# Patient Record
Sex: Female | Born: 1995 | Race: White | Hispanic: No | Smoking: Never smoker
Health system: Southern US, Community
[De-identification: ages and names within clinical notes are randomized; demographics above are authoritative.]

## PROBLEM LIST (undated history)

## (undated) DIAGNOSIS — E669 Obesity, unspecified: Secondary | ICD-10-CM

## (undated) DIAGNOSIS — J309 Allergic rhinitis, unspecified: Secondary | ICD-10-CM

## (undated) HISTORY — PX: WISDOM TOOTH EXTRACTION: SHX21

## (undated) HISTORY — DX: Obesity, unspecified: E66.9

## (undated) HISTORY — PX: NO PAST SURGERIES: SHX2092

## (undated) HISTORY — DX: Allergic rhinitis, unspecified: J30.9

---
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
## 2015-12-04 IMAGING — US US TRANSVAGINAL NON-OB
1 series · 14 of 25 positions shown · non-contrast
Comparison: None

CLINICAL DATA: Pelvic pain, nausea, vomiting, abdominal cramping
with fullness LEFT side of mid abdomen for 1 week

EXAM:
TRANSABDOMINAL AND TRANSVAGINAL ULTRASOUND OF PELVIS
TECHNIQUE: Both transabdominal and transvaginal ultrasound examinations of the
pelvis were performed. Transabdominal technique was performed for
global imaging of the pelvis including uterus, ovaries, adnexal
regions, and pelvic cul-de-sac. It was necessary to proceed with
endovaginal exam following the transabdominal exam to visualize the
endometrium and ovaries.

[Series 1: us transvaginal non-ob · 0.25mm/px · 14 of 67 slices shown]
[im 1/67]
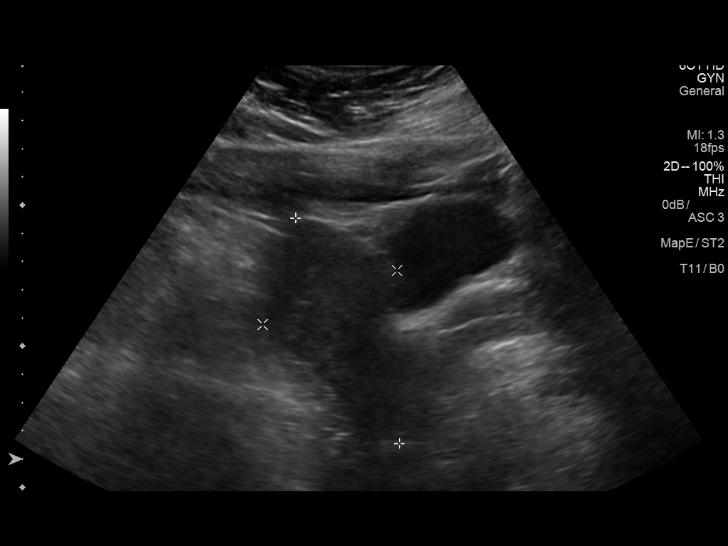
[im 6/67]
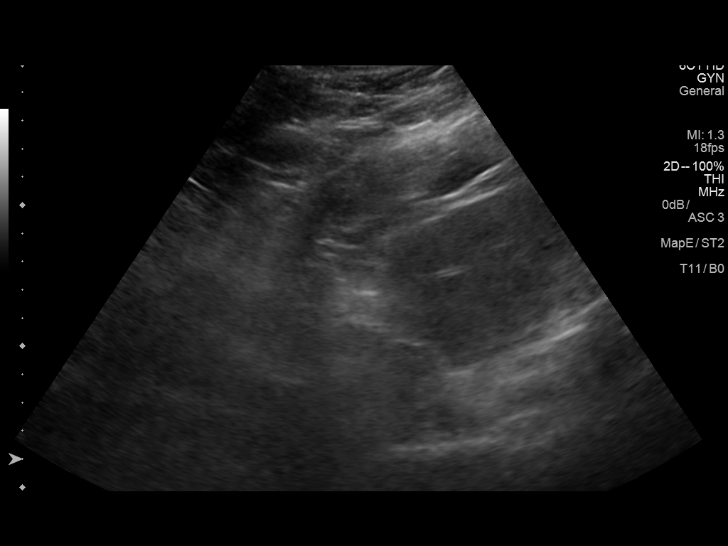
[im 12/67]
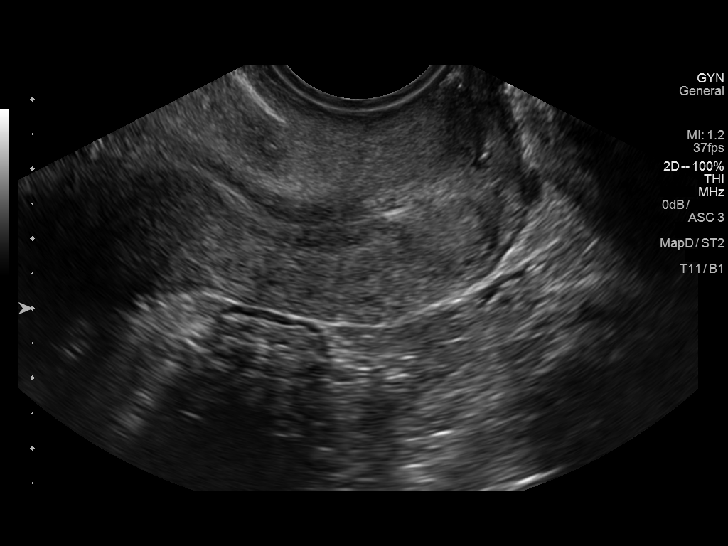
[im 17/67]
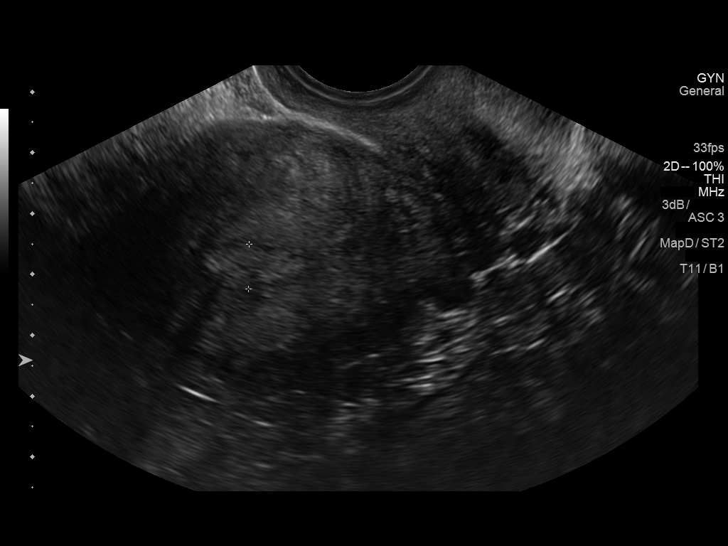
[im 23/67]
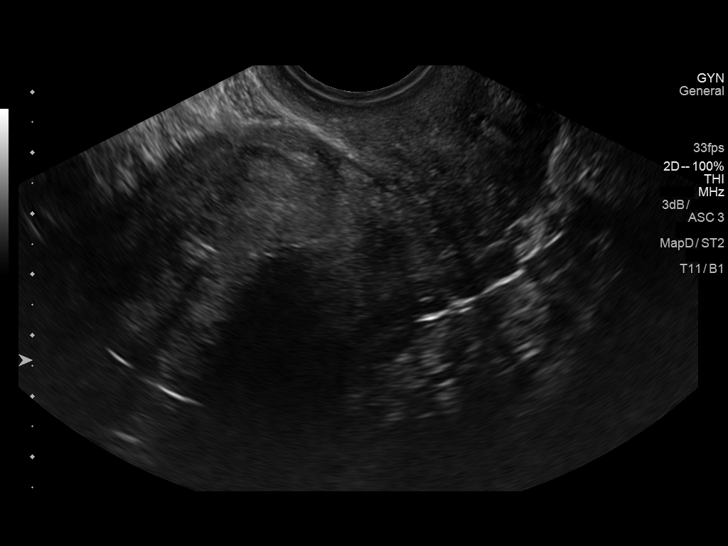
[im 25/67]
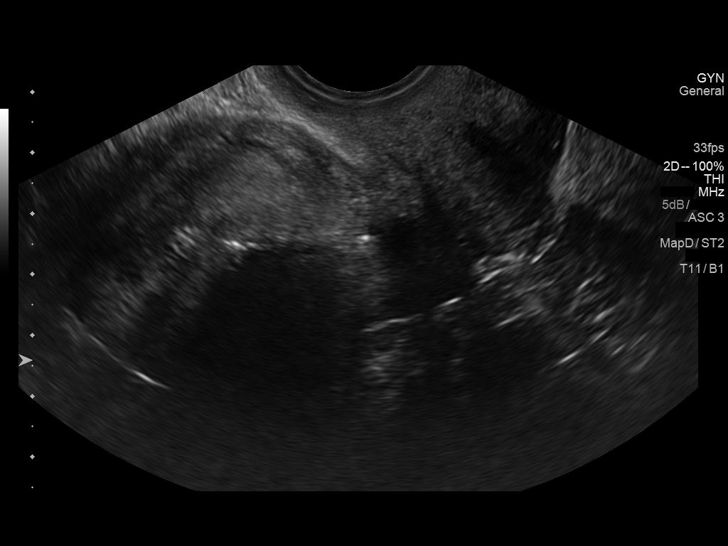
[im 31/67]
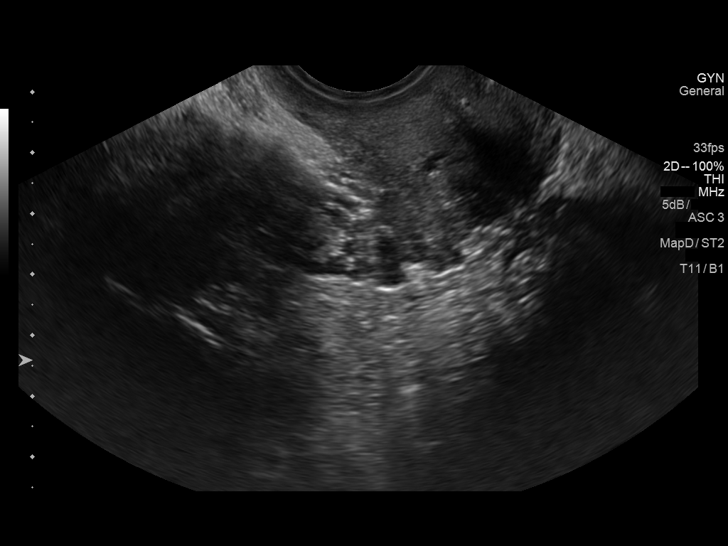
[im 36/67]
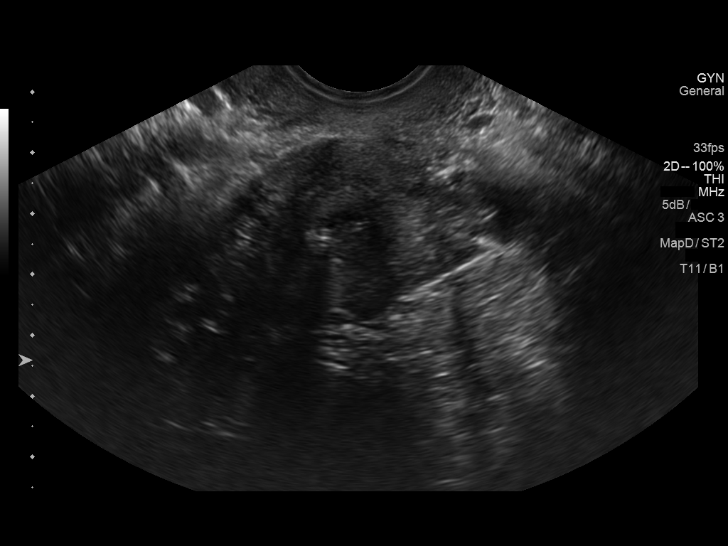
[im 42/67]
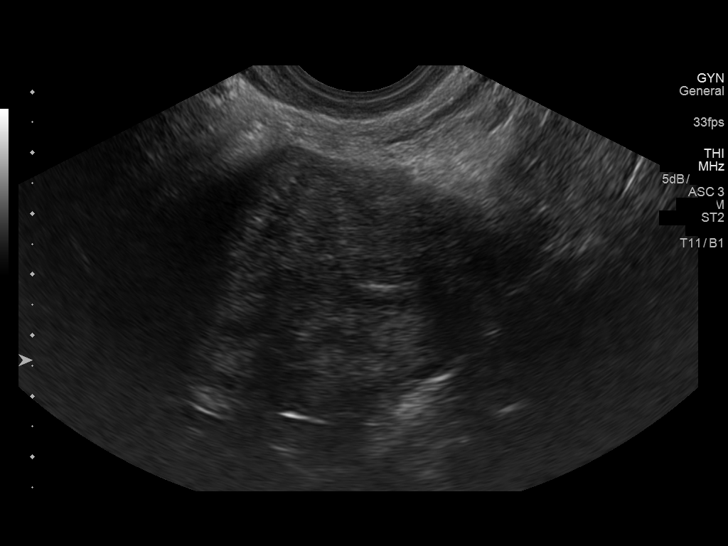
[im 45/67]
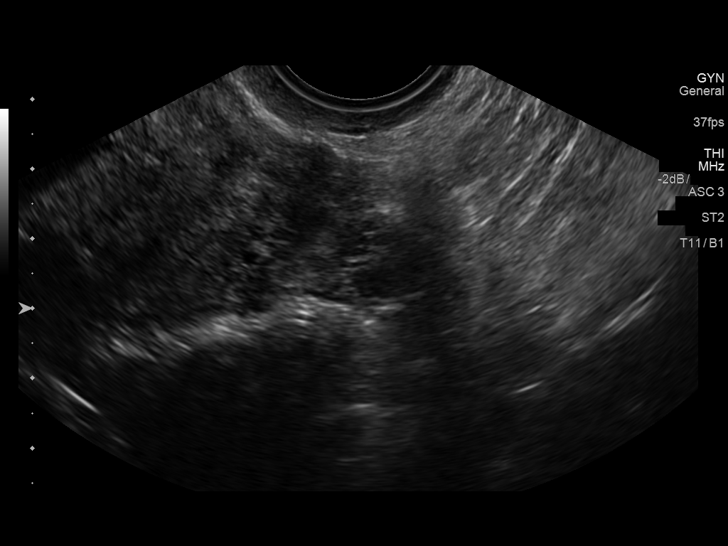
[im 50/67]
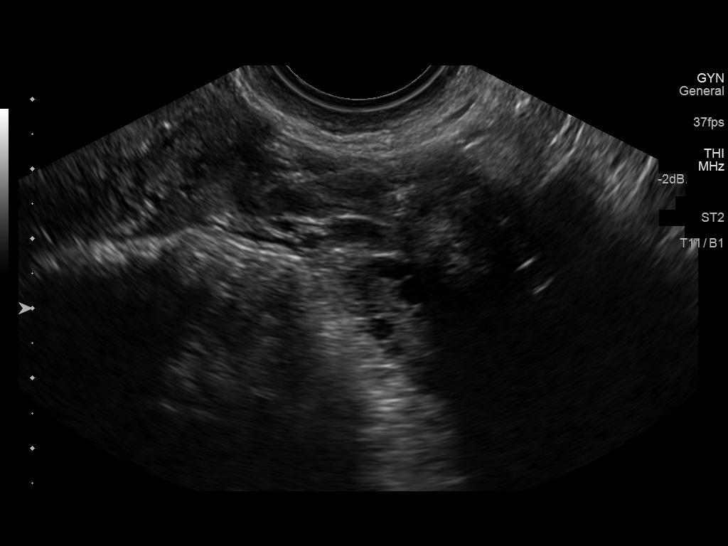
[im 56/67]
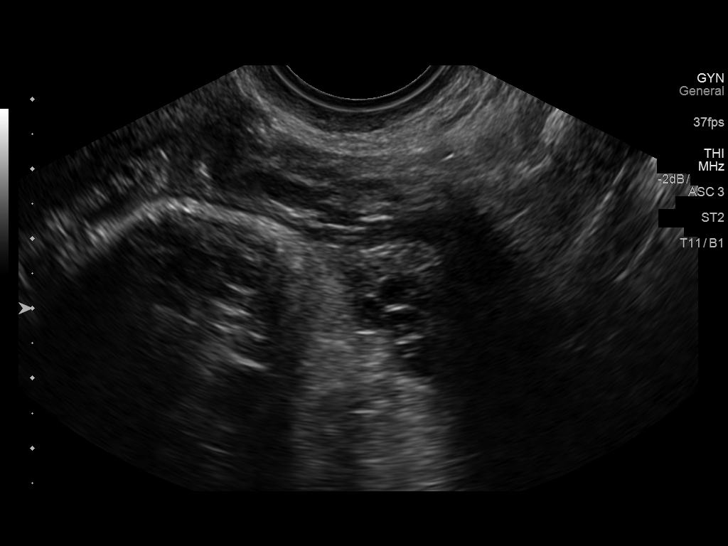
[im 61/67]
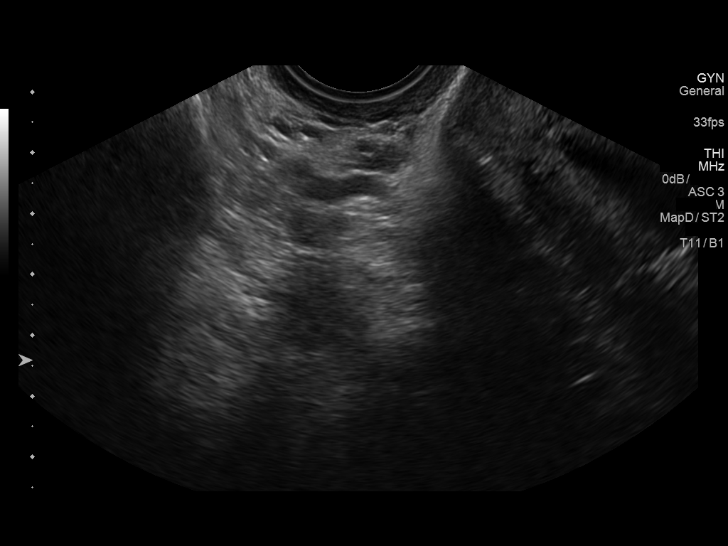
[im 67/67]
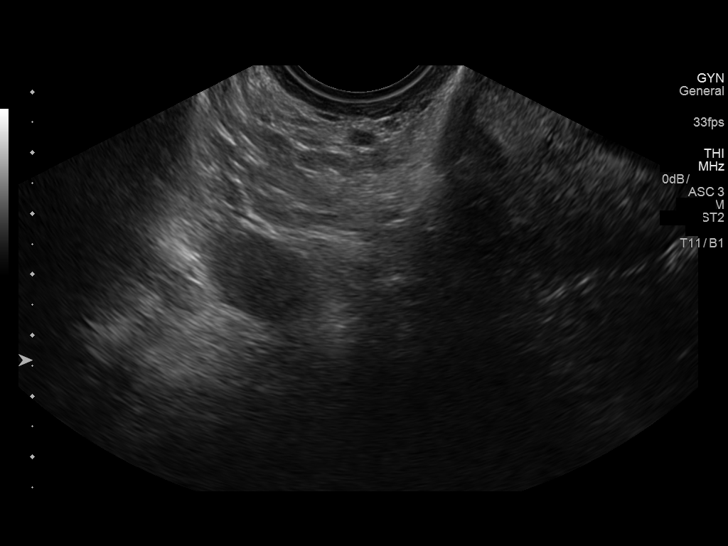

[14 of 25 positions shown; findings below may reference images not displayed]

FINDINGS: Uterus

Measurements: 8.8 x 5.1 x 6.5 cm. Normal morphology without mass

Endometrium

Thickness: 7 mm thick, normal. IUD present within endometrial canal
at mid upper uterine segments with shadowing. No definite
endometrial fluid.

Right ovary

Measurements: 1.9 x 1.6 x 1.5 cm. Suboptimally visualized due to
partial obscuration by bowel. Grossly normal morphology without mass

Left ovary

Measurements: 2.2 x 1.5 x 1.8 cm. Normal morphology without mass.

Other findings

No free pelvic fluid or adnexal masses.
IMPRESSION: IUD within uterus.

Otherwise negative exam.

## 2016-02-21 ENCOUNTER — Ambulatory Visit (INDEPENDENT_AMBULATORY_CARE_PROVIDER_SITE_OTHER): Payer: Medicaid Other | Admitting: Obstetrics and Gynecology

## 2016-02-21 ENCOUNTER — Encounter: Payer: Self-pay | Admitting: Obstetrics and Gynecology

## 2016-02-21 VITALS — BP 140/83 | HR 120 | Ht 69.0 in | Wt 296.9 lb

## 2016-02-21 DIAGNOSIS — Z30432 Encounter for removal of intrauterine contraceptive device: Secondary | ICD-10-CM

## 2016-02-21 DIAGNOSIS — Z3009 Encounter for other general counseling and advice on contraception: Secondary | ICD-10-CM

## 2016-02-21 MED ORDER — MEDROXYPROGESTERONE ACETATE 150 MG/ML IM SUSP: 150.0000 mg | INTRAMUSCULAR | 0 refills | Status: DC

## 2016-02-21 NOTE — Progress Notes (Addendum)
   GYNECOLOGY OFFICE PROCEDURE NOTE  Robin Paganinimber Cameron is a 21 y.o. G2P1001 here for Mirena IUD removal. The patient has had the IUD for almost 2 years.  Notes that she is experiencing worsening pelvic cramping, not relieved by OTC pain meds.  Cramping is intermittent, not related to her cycle.   IUD Removal  Patient identified, informed consent performed, consent signed.  Patient was in the dorsal lithotomy position, normal external genitalia was noted.  A speculum was placed in the patient's vagina, normal discharge was noted, no lesions. The cervix was visualized, no lesions, no abnormal discharge. (The strings of the IUD were not visualized, so Kelly forceps were introduced into the endometrial cavity and the IUD was grasped and removed in its entirety).  Patient tolerated the procedure well.    Patient will use Depo Provera for contraception.  Discussed that due to patient's obesity, the injections may be less effective and may lead to further weight gain. Discussed alternative options. Patient still desires Depo, will use a back up method (condoms)  Routine preventative health maintenance measures emphasized. To return in 2-3 days for Depo injection.    Hildred LaserAnika Amirra Herling, MD [facility]

## 2016-02-22 ENCOUNTER — Telehealth: Payer: Self-pay | Admitting: Obstetrics and Gynecology

## 2016-02-22 DIAGNOSIS — Z30013 Encounter for initial prescription of injectable contraceptive: Secondary | ICD-10-CM

## 2016-02-22 MED ORDER — MEDROXYPROGESTERONE ACETATE 150 MG/ML IM SUSP: 150.0000 mg | INTRAMUSCULAR | 3 refills | Status: DC

## 2016-02-22 NOTE — Telephone Encounter (Signed)
Error corrected. OG

## 2016-02-22 NOTE — Telephone Encounter (Signed)
PT NEEDS HER DEPO SENT TO [facility] INSTEAD OF [facility]

## 2016-02-24 ENCOUNTER — Ambulatory Visit (INDEPENDENT_AMBULATORY_CARE_PROVIDER_SITE_OTHER): Payer: Medicaid Other | Admitting: Obstetrics and Gynecology

## 2016-02-24 VITALS — BP 120/70 | HR 76 | Ht 69.0 in | Wt 296.0 lb

## 2016-02-24 DIAGNOSIS — Z30013 Encounter for initial prescription of injectable contraceptive: Secondary | ICD-10-CM

## 2016-02-24 MED ORDER — MEDROXYPROGESTERONE ACETATE 150 MG/ML IM SUSP
150.0000 mg | Freq: Once | INTRAMUSCULAR | Status: AC
  Administered 2016-02-24: 150 mg via INTRAMUSCULAR

## 2016-02-24 NOTE — Progress Notes (Signed)
Blood pressure 120/70, pulse 76, height 5\' 9"  (1.753 m), weight 296 lb (134.3 kg), last menstrual period 02/07/2016.  Date last pap: N/A Last Depo-Provera: Initiation today Side Effects if any: None Serum HCG indicated? No, neg UPT, 02/21/16 Depo-Provera 150 mg IM given by: Dorita Fray. Robin Cameron, CMA. Pt tolerated well. To call back with any adverse reactions. Next appointment due: April 27 - May 11

## 2016-02-24 NOTE — Patient Instructions (Signed)
Hormonal Contraception Information Introduction Estrogen and progesterone (progestin) are hormones used in many forms of birth control (contraception). These two hormones make up most hormonal contraceptives. Hormonal contraceptives use either:  A combination of estrogen hormone and progesterone hormone in one of these forms:  Pill. Pills come in various combinations of active hormone pills and nonhormonal pills. Different combinations of pills may give you a period once a month, once every 3 months, or no period at all. It is important to take the pills the same time each day.  Patch. The patch is placed on the lower abdomen every week for 3 weeks. On the fourth week, the patch is not placed.  Vaginal ring. The ring is placed in the vagina and left there for 3 weeks. It is then removed for 1 week.  Progesterone alone in one of these forms:  Pill. Hormone pills are taken every day of the cycle.  Intrauterine device (IUD). The IUD is inserted during a menstrual period and removed or replaced every 5 years or sooner.  Implant. Plastic rods are placed under the skin of the upper arm. They are removed or replaced every 3 years or sooner.  Injection. The injection is given once every 90 days. Pregnancy can still occur with any of these hormonal contraceptive methods. If you have any suspicion that you might be pregnant, take a pregnancy test and talk to your health care provider. Estrogen and progesterone contraceptives Estrogen and progesterone contraceptives can prevent pregnancy by:  Stopping the release of an egg (ovulation).  Thickening the mucus of the cervix, making it difficult for sperm to enter the uterus.  Changing the lining of the uterus. This change makes it more difficult for an egg to implant. Progesterone contraceptives Progesterone-only contraceptives can prevent pregnancy by:  Blocking ovulation. This occurs in many women, but some women will continue to  ovulate.  Preventing the entry of sperm into the uterus by keeping the cervical mucus thick and sticky.  Changing the lining of the uterus. This change makes it more difficult for an egg to implant. Side effects Talk to your health care provider about what side effects may affect you. If you develop persistent side effects or if the effects are severe, talk to your health care provider.  Estrogen. Side effects from estrogen occur more often in the first 2-3 months. They include:  Progesterone. Side effects of progesterone can vary. They include: Questions to ask This information is not intended to replace advice given to you by your health care provider. Make sure you discuss any questions you have with your health care provider. Document Released: 01/21/2007 Document Revised: 10/05/2015 Document Reviewed: 06/15/2012  2017 Elsevier  

## 2016-05-11 ENCOUNTER — Ambulatory Visit: Payer: Medicaid Other

## 2016-05-14 ENCOUNTER — Ambulatory Visit (INDEPENDENT_AMBULATORY_CARE_PROVIDER_SITE_OTHER): Payer: Medicaid Other | Admitting: Obstetrics and Gynecology

## 2016-05-14 VITALS — BP 111/84 | HR 69 | Wt 304.2 lb

## 2016-05-14 DIAGNOSIS — Z3042 Encounter for surveillance of injectable contraceptive: Secondary | ICD-10-CM

## 2016-05-14 MED ORDER — MEDROXYPROGESTERONE ACETATE 150 MG/ML IM SUSP
150.0000 mg | Freq: Once | INTRAMUSCULAR | Status: AC
  Administered 2016-05-14: 150 mg via INTRAMUSCULAR

## 2016-05-14 NOTE — Progress Notes (Signed)
Patient ID: Robin Cameron, female   DOB: 06-23-1995, 20 y.o.   MRN: [id number] Pt presents for depo-provera injection without any undesirable side effects.

## 2016-07-31 ENCOUNTER — Ambulatory Visit: Payer: Medicaid Other

## 2016-08-07 ENCOUNTER — Ambulatory Visit (INDEPENDENT_AMBULATORY_CARE_PROVIDER_SITE_OTHER): Payer: Medicaid Other | Admitting: Obstetrics and Gynecology

## 2016-08-07 VITALS — BP 120/84 | HR 62 | Wt 305.7 lb

## 2016-08-07 DIAGNOSIS — Z3042 Encounter for surveillance of injectable contraceptive: Secondary | ICD-10-CM

## 2016-08-07 MED ORDER — MEDROXYPROGESTERONE ACETATE 150 MG/ML IM SUSP
150.0000 mg | Freq: Once | INTRAMUSCULAR | Status: AC
  Administered 2016-08-07: 150 mg via INTRAMUSCULAR

## 2016-08-07 NOTE — Progress Notes (Signed)
Pt is here for depo provera, denies any s/e 

## 2016-08-21 NOTE — Progress Notes (Signed)
I have reviewed the record and concur with patient management and plan.  Hessie Varone, MD [facility]     

## 2016-10-23 ENCOUNTER — Ambulatory Visit (INDEPENDENT_AMBULATORY_CARE_PROVIDER_SITE_OTHER): Payer: Medicaid Other | Admitting: Obstetrics and Gynecology

## 2016-10-23 VITALS — Wt 299.8 lb

## 2016-10-23 DIAGNOSIS — Z3042 Encounter for surveillance of injectable contraceptive: Secondary | ICD-10-CM | POA: Diagnosis not present

## 2016-10-23 MED ORDER — MEDROXYPROGESTERONE ACETATE 150 MG/ML IM SUSP
150.0000 mg | Freq: Once | INTRAMUSCULAR | Status: AC
  Administered 2016-10-23: 150 mg via INTRAMUSCULAR

## 2016-10-23 NOTE — Progress Notes (Signed)
Pt is here for depo provera inj, she is doing well, denies any s/e 

## 2016-10-24 NOTE — Progress Notes (Signed)
I have reviewed the record and concur with patient management. Continue q 3 month Depo Provera injections.

## 2017-01-09 ENCOUNTER — Other Ambulatory Visit: Payer: Self-pay | Admitting: Obstetrics and Gynecology

## 2017-01-09 DIAGNOSIS — Z30013 Encounter for initial prescription of injectable contraceptive: Secondary | ICD-10-CM

## 2017-01-09 NOTE — Telephone Encounter (Signed)
Please inform patient that she is due for ann annual exam.  She can have 1 additional refill until she is seen.

## 2017-01-10 ENCOUNTER — Telehealth: Payer: Self-pay | Admitting: Obstetrics and Gynecology

## 2017-01-10 ENCOUNTER — Other Ambulatory Visit: Payer: Self-pay

## 2017-01-10 DIAGNOSIS — Z30013 Encounter for initial prescription of injectable contraceptive: Secondary | ICD-10-CM

## 2017-01-10 MED ORDER — MEDROXYPROGESTERONE ACETATE 150 MG/ML IM SUSP: 150.0000 mg | INTRAMUSCULAR | 0 refills | Status: DC

## 2017-01-10 NOTE — Telephone Encounter (Signed)
Pt  Aware depo erx. Pt needs AE for further refills. Pt to make appt tomorrow after inj.

## 2017-01-10 NOTE — Telephone Encounter (Signed)
The patient called and stated that she needs her Depo injection refilled before tomorrow , the day of her appointment, The patient would also like a call from a nurse. Please advise.

## 2017-01-11 ENCOUNTER — Ambulatory Visit: Payer: Medicaid Other

## 2017-02-04 ENCOUNTER — Encounter: Payer: Medicaid Other | Admitting: Obstetrics and Gynecology

## 2017-02-19 ENCOUNTER — Ambulatory Visit (INDEPENDENT_AMBULATORY_CARE_PROVIDER_SITE_OTHER): Payer: Medicaid Other | Admitting: Obstetrics and Gynecology

## 2017-02-19 ENCOUNTER — Encounter: Payer: Self-pay | Admitting: Obstetrics and Gynecology

## 2017-02-19 VITALS — BP 136/72 | HR 102 | Wt 312.8 lb

## 2017-02-19 DIAGNOSIS — Z13220 Encounter for screening for lipoid disorders: Secondary | ICD-10-CM | POA: Diagnosis not present

## 2017-02-19 DIAGNOSIS — Z113 Encounter for screening for infections with a predominantly sexual mode of transmission: Secondary | ICD-10-CM

## 2017-02-19 DIAGNOSIS — Z3009 Encounter for other general counseling and advice on contraception: Secondary | ICD-10-CM | POA: Diagnosis not present

## 2017-02-19 DIAGNOSIS — Z01419 Encounter for gynecological examination (general) (routine) without abnormal findings: Secondary | ICD-10-CM

## 2017-02-19 DIAGNOSIS — F39 Unspecified mood [affective] disorder: Secondary | ICD-10-CM

## 2017-02-19 DIAGNOSIS — Z23 Encounter for immunization: Secondary | ICD-10-CM

## 2017-02-19 DIAGNOSIS — Z124 Encounter for screening for malignant neoplasm of cervix: Secondary | ICD-10-CM

## 2017-02-19 NOTE — Progress Notes (Signed)
GYNECOLOGY ANNUAL PHYSICAL EXAM PROGRESS NOTE  Subjective:    Robin Cameron is a 22 y.o. G85P1001 female who presents for an annual exam.  The patient is sexually active.The patient wears seatbelts: yes. The patient participates in regular exercise: no. Has the patient ever been transfused or tattooed?: no. The patient reports that there is not domestic violence in her life.    The patient has the following complaints today:  1. Desires to discuss contraception. Has been off Depo Provera x 2 months. Notes improvement in mood since discontinuing as she was having labile moods and mood swings.  Is still taking her Wellbutrin as prescribed.    Gynecologic History Menarche age: 75 No LMP recorded. Patient has had an injection. Contraception: Depo-Provera injections History of STI's: Denies Last Pap: no prior pap history.     Obstetric History   G2   P1   T1   P0   A0   L1    SAB0   TAB0   Ectopic0   Multiple0   Live Births1     # Outcome Date GA Lbr Len/2nd Weight Sex Delivery Anes PTL Lv  2 Term 08/09/12 [redacted]w[redacted]d 00:10 9 lb (4.082 kg) F Vag-Spont Local  LIV  1 Gravida               Past Medical History:  Diagnosis Date  . Allergic rhinitis   . Obesity     Past Surgical History:  Procedure Laterality Date  . NO PAST SURGERIES    . WISDOM TOOTH EXTRACTION      Family History  Problem Relation Age of Onset  . COPD Maternal Grandmother   . Hypertension Maternal Grandmother   . Hyperlipidemia Maternal Grandfather   . Hypertension Maternal Grandfather   . COPD Maternal Grandfather     Social History   Socioeconomic History  . Marital status: Single    Spouse name: Not on file  . Number of children: Not on file  . Years of education: Not on file  . Highest education level: Not on file  Social Needs  . Financial resource strain: Not on file  . Food insecurity - worry: Not on file  . Food insecurity - inability: Not on file  . Transportation needs - medical: Not on  file  . Transportation needs - non-medical: Not on file  Occupational History  . Not on file  Tobacco Use  . Smoking status: Never Smoker  . Smokeless tobacco: Never Used  Substance and Sexual Activity  . Alcohol use: No  . Drug use: No  . Sexual activity: Yes    Birth control/protection: Injection    Comment: only one partner  Other Topics Concern  . Not on file  Social History Narrative  . Not on file    Current Outpatient Medications on File Prior to Visit  Medication Sig Dispense Refill  . BuPROPion HCl (WELLBUTRIN PO) Take by mouth.    . medroxyPROGESTERone (DEPO-PROVERA) 150 MG/ML injection Inject 1 mL (150 mg total) into the muscle every 3 (three) months. 1 mL 0   No current facility-administered medications on file prior to visit.     Allergies  Allergen Reactions  . Shrimp [Shellfish Allergy] Rash  . Mushroom Extract Complex Rash      Review of Systems Constitutional: negative for chills, fatigue, fevers and sweats Eyes: negative for irritation, redness and visual disturbance Ears, nose, mouth, throat, and face: negative for hearing loss, nasal congestion, snoring and tinnitus Respiratory: negative  for asthma, cough, sputum Cardiovascular: negative for chest pain, dyspnea, exertional chest pressure/discomfort, irregular heart beat, palpitations and syncope Gastrointestinal: negative for abdominal pain, change in bowel habits, nausea and vomiting Genitourinary: negative for abnormal menstrual periods, genital lesions, sexual problems and vaginal discharge, dysuria and urinary incontinence Integument/breast: negative for breast lump, breast tenderness and nipple discharge Hematologic/lymphatic: negative for bleeding and easy bruising Musculoskeletal:negative for back pain and muscle weakness Neurological: negative for dizziness, headaches, vertigo and weakness Endocrine: negative for diabetic symptoms including polydipsia, polyuria and skin  dryness Allergic/Immunologic: negative for hay fever and urticaria    Psychological: positive for - irritability and mood swings.  Negative for - depression, obsessive thoughts, sleep disturbances or suicidal ideation    Objective:  Blood pressure 136/72, pulse (!) 102, weight (!) 312 lb 12.8 oz (141.9 kg). Body mass index is 46.19 kg/m.  General Appearance:    Alert, cooperative, no distress, appears stated age, morbid obesity  Head:    Normocephalic, without obvious abnormality, atraumatic  Eyes:    PERRL, conjunctiva/corneas clear, EOM's intact, both eyes  Ears:    Normal external ear canals, both ears  Nose:   Nares normal, septum midline, mucosa normal, no drainage or sinus tenderness  Throat:   Lips, mucosa, and tongue normal; teeth and gums normal  Neck:   Supple, symmetrical, trachea midline, no adenopathy; thyroid: no enlargement/tenderness/nodules; no carotid bruit or JVD  Back:     Symmetric, no curvature, ROM normal, no CVA tenderness  Lungs:     Clear to auscultation bilaterally, respirations unlabored  Chest Wall:    No tenderness or deformity   Heart:    Regular rate and rhythm, S1 and S2 normal, no murmur, rub or gallop  Breast Exam:    No tenderness, masses, or nipple abnormality  Abdomen:     Soft, non-tender, bowel sounds active all four quadrants, no masses, no organomegaly.    Genitalia:    Pelvic:external genitalia normal, vagina without lesions, discharge, or tenderness, rectovaginal septum  normal. Cervix normal in appearance, no cervical motion tenderness, no adnexal masses or tenderness.  Uterus normal size, shape, mobile, regular contours, nontender.  Rectal:    Normal external sphincter.  No hemorrhoids appreciated. Internal exam not done.   Extremities:   Extremities normal, atraumatic, no cyanosis or edema  Pulses:   2+ and symmetric all extremities  Skin:   Skin color, texture, turgor normal, no rashes or lesions  Lymph nodes:   Cervical, supraclavicular,  and axillary nodes normal  Neurologic:   CNII-XII intact, normal strength, sensation and reflexes throughout     Labs:  Lab Results  Component Value Date   WBC 5.4 11/16/2014   HGB 12.1 11/16/2014   HCT 36.0 11/16/2014   MCV 81.4 11/16/2014   PLT 228 11/16/2014    Lab Results  Component Value Date   CREATININE 0.66 11/16/2014   BUN 14 11/16/2014   NA 140 11/16/2014   K 4.3 11/16/2014   CL 107 11/16/2014   CO2 26 11/16/2014    Lab Results  Component Value Date   ALT 14 11/16/2014   AST 16 11/16/2014   ALKPHOS 71 11/16/2014   BILITOT 0.5 11/16/2014    No results found for: TSH   Assessment:   Healthy female exam.  Cervical cancer screening Morbid obesity Contraception counseling H/o mood disorder NOS STD screening  Plan:     Blood tests: CBC with diff, Comprehensive metabolic panel, Lipoproteins and TSH. Breast self exam technique reviewed and  patient encouraged to perform self-exam monthly. Contraception: none currently.  Reviewed all forms of birth control options available including abstinence; over the counter/barrier methods; hormonal contraceptive medication including pill, patch, ring, injection,contraceptive implant; hormonal and nonhormonal IUDs; permanent sterilization options were not discussed.  modalities. Risks and benefits reviewed.  Questions were answered.  Information was given to patient to review.  Discussed healthy lifestyle modifications. Pap smear performed today.  For flu vaccine today.  Continue Wellbutrin as prescribed.  Notes that discontinuing Depo Provera has also helped her mood. To f/u if symptoms worsen.  Discussed HPV vaccine. Info given.  STD screening ordered at patient's request. Notes she is planning on getting married in October.  Follow up in 1 year, or sooner if needed.     Hildred Laser, MD [facility]

## 2017-02-19 NOTE — Patient Instructions (Signed)

## 2017-02-20 ENCOUNTER — Encounter: Payer: Self-pay | Admitting: Obstetrics and Gynecology

## 2017-02-20 LAB — COMPREHENSIVE METABOLIC PANEL
A/G RATIO: 1.8 (ref 1.2–2.2)
ALT: 26 IU/L (ref 0–32)
AST: 18 IU/L (ref 0–40)
Albumin: 4.5 g/dL (ref 3.5–5.5)
Alkaline Phosphatase: 82 IU/L (ref 39–117)
BUN/Creatinine Ratio: 15 (ref 9–23)
BUN: 11 mg/dL (ref 6–20)
Bilirubin Total: 0.3 mg/dL (ref 0.0–1.2)
CALCIUM: 9.3 mg/dL (ref 8.7–10.2)
CHLORIDE: 107 mmol/L — AB (ref 96–106)
CO2: 22 mmol/L (ref 20–29)
Creatinine, Ser: 0.74 mg/dL (ref 0.57–1.00)
GFR, EST AFRICAN AMERICAN: 134 mL/min/{1.73_m2} (ref >59)
GFR, EST NON AFRICAN AMERICAN: 116 mL/min/{1.73_m2} (ref >59)
GLOBULIN, TOTAL: 2.5 g/dL (ref 1.5–4.5)
Glucose: 90 mg/dL (ref 65–99)
POTASSIUM: 4.2 mmol/L (ref 3.5–5.2)
SODIUM: 143 mmol/L (ref 134–144)
TOTAL PROTEIN: 7 g/dL (ref 6.0–8.5)

## 2017-02-20 LAB — LIPID PANEL
CHOL/HDL RATIO: 3.7 ratio (ref 0.0–4.4)
Cholesterol, Total: 163 mg/dL (ref 100–199)
HDL: 44 mg/dL (ref >39)
LDL Calculated: 100 mg/dL — ABNORMAL HIGH (ref 0–99)
Triglycerides: 95 mg/dL (ref 0–149)
VLDL Cholesterol Cal: 19 mg/dL (ref 5–40)

## 2017-02-20 LAB — CBC
HEMATOCRIT: 40.1 % (ref 34.0–46.6)
Hemoglobin: 13.2 g/dL (ref 11.1–15.9)
MCH: 28.2 pg (ref 26.6–33.0)
MCHC: 32.9 g/dL (ref 31.5–35.7)
MCV: 86 fL (ref 79–97)
PLATELETS: 300 10*3/uL (ref 150–379)
RBC: 4.68 x10E6/uL (ref 3.77–5.28)
RDW: 14.2 % (ref 12.3–15.4)
WBC: 7.7 10*3/uL (ref 3.4–10.8)

## 2017-02-20 LAB — RPR: RPR: NONREACTIVE

## 2017-02-20 LAB — HIV ANTIBODY (ROUTINE TESTING W REFLEX): HIV SCREEN 4TH GENERATION: NONREACTIVE

## 2017-02-20 LAB — TSH: TSH: 1.79 u[IU]/mL (ref 0.450–4.500)

## 2017-02-22 LAB — PAP IG, CT-NG, RFX HPV ASCU
Chlamydia, Nuc. Acid Amp: NEGATIVE
GONOCOCCUS BY NUCLEIC ACID AMP: NEGATIVE
PAP Smear Comment: 0

## 2017-09-23 ENCOUNTER — Encounter: Payer: Self-pay | Admitting: Obstetrics and Gynecology

## 2017-09-23 ENCOUNTER — Ambulatory Visit: Payer: Medicaid Other | Admitting: Obstetrics and Gynecology

## 2017-09-23 VITALS — BP 130/83 | HR 88 | Ht 67.0 in | Wt 301.8 lb

## 2017-09-23 DIAGNOSIS — R102 Pelvic and perineal pain: Secondary | ICD-10-CM

## 2017-09-23 DIAGNOSIS — N926 Irregular menstruation, unspecified: Secondary | ICD-10-CM | POA: Diagnosis not present

## 2017-09-23 DIAGNOSIS — R1011 Right upper quadrant pain: Secondary | ICD-10-CM

## 2017-09-23 DIAGNOSIS — R1114 Bilious vomiting: Secondary | ICD-10-CM

## 2017-09-23 LAB — POCT URINE PREGNANCY: Preg Test, Ur: NEGATIVE

## 2017-09-23 MED ORDER — ONDANSETRON 4 MG PO TBDP: 4.0000 mg | ORAL_TABLET | Freq: Four times a day (QID) | ORAL | 0 refills | Status: DC | PRN

## 2017-09-23 NOTE — Progress Notes (Signed)
    GYNECOLOGY PROGRESS NOTE  Subjective:    Patient ID: Robin Cameron, female    DOB: 11-Jan-1996, 22 y.o.   MRN: [id number]  HPI  Patient is a 22 y.o. G58P1001 female who presents for complaints of vomiting and pelvic pain.  She notes that she has been vomiting for the past 6 days, and has had pelvic pain x 3 weeks.  Patient's last menstrual period was 08/03/2017. Was on Depo Provera for 1 year, but discontinued about 7 months ago. Had last unprotected sex on August 23rd (condom broke). Patient's last menstrual period was 08/03/2017. Reports she took a pregnancy test at home that was negative late last week.    The following portions of the patient's history were reviewed and updated as appropriate: allergies, current medications, past family history, past medical history, past social history, past surgical history and problem list.  Review of Systems Pertinent items noted in HPI and remainder of comprehensive ROS otherwise negative.   Objective:   Blood pressure 130/83, pulse 88, height 5\' 7"  (1.702 m), weight (!) 301 lb 12.8 oz (136.9 kg), last menstrual period 08/03/2017. General appearance: alert and no distress Abdomen: normal findings: bowel sounds normal, no masses palpable and soft, large pannus and abnormal findings:  mild tenderness in the epigastrium and in the LLQ Pelvic: deferred   Assessment:   Upper abdominal pain RLQ pain Pelvic pain Nausea/vomiting Irregular menses Morbid obesity  Plan:   -Patient with nausea/vomiting and RLQ pain and LLQ pain, irregular menses.  UPT today negative, however performed prior to 4 weeks and missed period with recent episode of unprotected intercourse several weeks ago. Will order BHCG.  - Patient notes that her pain is unrelated to eating, however sometimes can have bilious vomiting. Will check RUQ for pain with ultrasound.  Also CMP ordered with lipase.  - Discussed contraception with patient, currently using condoms (although recent  incident with current method).  Declines use of other agents at this time.   Follow up if symptoms worsen. Will prescribe Zofran for nausea.   Hildred Laser, MD [facility]

## 2017-09-23 NOTE — Patient Instructions (Signed)

## 2017-09-23 NOTE — Progress Notes (Signed)
PT is present today for confirmation of pregnancy. Pt LMP 08/03/17. UPT done today results were negative. Pt stated that she is doing well no complaints.

## 2017-09-24 ENCOUNTER — Ambulatory Visit (INDEPENDENT_AMBULATORY_CARE_PROVIDER_SITE_OTHER): Payer: Medicaid Other

## 2017-09-24 DIAGNOSIS — N926 Irregular menstruation, unspecified: Secondary | ICD-10-CM | POA: Diagnosis not present

## 2017-09-24 DIAGNOSIS — R102 Pelvic and perineal pain: Secondary | ICD-10-CM

## 2017-09-24 LAB — BETA HCG QUANT (REF LAB): hCG Quant: <1 m[IU]/mL

## 2017-09-24 LAB — COMPREHENSIVE METABOLIC PANEL
ALK PHOS: 88 IU/L (ref 39–117)
ALT: 12 IU/L (ref 0–32)
AST: 12 IU/L (ref 0–40)
Albumin/Globulin Ratio: 1.6 (ref 1.2–2.2)
Albumin: 4.2 g/dL (ref 3.5–5.5)
BUN/Creatinine Ratio: 24 — ABNORMAL HIGH (ref 9–23)
BUN: 16 mg/dL (ref 6–20)
Bilirubin Total: <0.2 mg/dL (ref 0.0–1.2)
CO2: 20 mmol/L (ref 20–29)
Calcium: 9.2 mg/dL (ref 8.7–10.2)
Chloride: 106 mmol/L (ref 96–106)
Creatinine, Ser: 0.66 mg/dL (ref 0.57–1.00)
GFR calc Af Amer: 145 mL/min/{1.73_m2} (ref >59)
GFR calc non Af Amer: 126 mL/min/{1.73_m2} (ref >59)
GLOBULIN, TOTAL: 2.7 g/dL (ref 1.5–4.5)
Glucose: 95 mg/dL (ref 65–99)
POTASSIUM: 4.4 mmol/L (ref 3.5–5.2)
SODIUM: 140 mmol/L (ref 134–144)
Total Protein: 6.9 g/dL (ref 6.0–8.5)

## 2017-10-31 ENCOUNTER — Telehealth: Payer: Self-pay | Admitting: Obstetrics and Gynecology

## 2017-10-31 NOTE — Telephone Encounter (Signed)
Pt was called to see if she wanted to come into the office to be seen by DJE. Pt did not answer the phone and was unable to leave a message due to the mailbox being full.

## 2017-10-31 NOTE — Telephone Encounter (Signed)
Patient called stating she could not work today and will not be able to work tomorrow due to heavy menstrual bleeding and cramping. She would like a note for work or be seen for an appointment today. Dr Chalmers Guest schedule is full today so let me know if and when I can get her in.Thanks

## 2017-11-05 NOTE — Telephone Encounter (Signed)
Pt called no answer and was unable to leave a message due to mailbox being full. Was calling to check on pt due to her recent call to the office.

## 2017-11-06 NOTE — Telephone Encounter (Signed)
Pt called no answer and unable to leave voicemail due to voicemail being full. Sent pt a mychart message to see how she was doing.

## 2018-02-26 ENCOUNTER — Encounter: Payer: Medicaid Other | Admitting: Obstetrics and Gynecology

## 2018-03-07 ENCOUNTER — Encounter: Payer: Medicaid Other | Admitting: Obstetrics and Gynecology

## 2018-04-10 ENCOUNTER — Telehealth: Payer: Self-pay

## 2018-04-10 NOTE — Telephone Encounter (Signed)
Pt was called to reschedule her annual exam.

## 2018-04-15 ENCOUNTER — Encounter: Payer: Medicaid Other | Admitting: Obstetrics and Gynecology

## 2018-06-13 ENCOUNTER — Encounter: Payer: Self-pay | Admitting: Obstetrics and Gynecology

## 2018-06-24 ENCOUNTER — Other Ambulatory Visit: Payer: Self-pay

## 2018-06-24 ENCOUNTER — Ambulatory Visit: Payer: Medicaid Other | Admitting: Certified Nurse Midwife

## 2018-06-24 ENCOUNTER — Encounter: Payer: Self-pay | Admitting: Certified Nurse Midwife

## 2018-06-24 VITALS — BP 140/86 | HR 101 | Ht 68.0 in | Wt 323.7 lb

## 2018-06-24 DIAGNOSIS — N926 Irregular menstruation, unspecified: Secondary | ICD-10-CM

## 2018-06-24 LAB — POCT URINE PREGNANCY: Preg Test, Ur: POSITIVE — AB

## 2018-06-24 MED ORDER — DOXYLAMINE-PYRIDOXINE 10-10 MG PO TBEC: 2.0000 | DELAYED_RELEASE_TABLET | Freq: Every day | ORAL | 5 refills | Status: DC

## 2018-06-24 MED ORDER — FOLIC ACID 1 MG PO TABS: 1.0000 mg | ORAL_TABLET | Freq: Every day | ORAL | 10 refills | Status: DC

## 2018-06-24 NOTE — Progress Notes (Signed)
GYN ENCOUNTER NOTE  Subjective:       Robin Cameron is a 23 y.o. [phone] female here for pregnancy confirmation.   Reports 10 positive home pregnancy test. Endorses nausea with occasional vomiting, several food aversions, breast tenderness and intermittent abdominal cramping.   History of irregular menses after depo-provera use.   Denies difficulty breathing or respiratory distress, chest pain, abdominal pain, vaginal bleeding, dysuria, and leg pain or swelling.    Gynecologic History  Patient's last menstrual period was 04/27/2018.   Estimated date of birth: 02/01/2019.   Gestational age: 10 weeks 2 days.   Contraception: none  Last Pap: 02/2017. Results: normal.   Obstetric History  OB History  Gravida Para Term Preterm AB Living  2 2 2     2   SAB TAB Ectopic Multiple Live Births          2    # Outcome Date GA Lbr Len/2nd Weight Sex Delivery Anes PTL Lv  2 Term 2015    F Vag-Spont   LIV  1 Term 08/09/12 [redacted]w[redacted]d 00:10 9 lb (4.082 kg) F Vag-Spont Local  LIV     Birth Comments: didn't know she was pregnant    Past Medical History:  Diagnosis Date  . Allergic rhinitis   . Obesity     Past Surgical History:  Procedure Laterality Date  . WISDOM TOOTH EXTRACTION      Allergies  Allergen Reactions  . Shrimp [Shellfish Allergy] Rash  . Mushroom Extract Complex Rash    Social History   Socioeconomic History  . Marital status: Single    Spouse name: Not on file  . Number of children: Not on file  . Years of education: Not on file  . Highest education level: Not on file  Occupational History  . Not on file  Social Needs  . Financial resource strain: Not on file  . Food insecurity:    Worry: Not on file    Inability: Not on file  . Transportation needs:    Medical: Not on file    Non-medical: Not on file  Tobacco Use  . Smoking status: Never Smoker  . Smokeless tobacco: Never Used  Substance and Sexual Activity  . Alcohol use: Not Currently    Comment:  ocass  . Drug use: No  . Sexual activity: Yes    Comment: only one partner  Lifestyle  . Physical activity:    Days per week: Not on file    Minutes per session: Not on file  . Stress: Not on file  Relationships  . Social connections:    Talks on phone: Not on file    Gets together: Not on file    Attends religious service: Not on file    Active member of club or organization: Not on file    Attends meetings of clubs or organizations: Not on file    Relationship status: Not on file  . Intimate partner violence:    Fear of current or ex partner: Not on file    Emotionally abused: Not on file    Physically abused: Not on file    Forced sexual activity: Not on file  Other Topics Concern  . Not on file  Social History Narrative  . Not on file    Family History  Problem Relation Age of Onset  . COPD Maternal Grandmother   . Hypertension Maternal Grandmother   . Hyperlipidemia Maternal Grandfather   . Hypertension Maternal Grandfather   . COPD  Maternal Grandfather   . Diabetes Mother   . Diabetes Father     The following portions of the patient's history were reviewed and updated as appropriate: allergies, current medications, past family history, past medical history, past social history, past surgical history and problem list.  Review of Systems  ROS negative except as noted above. Information obtained from patient.   Objective:   BP 140/86   Pulse (!) 101   Ht 5\' 8"  (1.727 m)   Wt (!) 323 lb 11.2 oz (146.8 kg)   LMP 04/27/2018   BMI 49.22 kg/m    CONSTITUTIONAL: Well-developed, well-nourished female in no acute distress.   PHYSICAL EXAM: Not indicated.   Recent Results (from the past 2160 hour(s))  POCT urine pregnancy     Status: Abnormal   Collection Time: 06/24/18  2:46 PM  Result Value Ref Range   Preg Test, Ur Positive (A) Negative    Assessment:   1. Missed menses - POCT urine pregnancy - US OB LESS THAN 14 WEEKS WITH OB TRANSVAGINAL;  Future  2. Irregular menses - US OB LESS THAN 14 WEEKS WITH OB TRANSVAGINAL; Future  Plan:   First trimester education, see AVS.   Encouraged PNV with folic acid and DHA, samples provided.   Rx: Folic acid and Diclegis, see orders.   Reviewed red flag symptoms and when to call.   RTC x 1 week for dating/viability ultrasound and nurse intake.   RTC x 4-5 weeks for NOB PE with Dr. Valentino Saxonherry or sooner if needed.     Gunnar BullaJenkins Michelle Neytiri Asche, CNM [facility], [facility] 06/24/18 3:33 PM

## 2018-06-24 NOTE — Patient Instructions (Signed)
Round Ligament Pain  The round ligament is a cord of muscle and tissue that helps support the uterus. It can become a source of pain during pregnancy if it becomes stretched or twisted as the baby grows. The pain usually begins in the second trimester (13-28 weeks) of pregnancy, and it can come and go until the baby is delivered. It is not a serious problem, and it does not cause harm to the baby. Round ligament pain is usually a short, sharp, and pinching pain, but it can also be a dull, lingering, and aching pain. The pain is felt in the lower side of the abdomen or in the groin. It usually starts deep in the groin and moves up to the outside of the hip area. The pain may occur when you:  Suddenly change position, such as quickly going from a sitting to standing position.  Roll over in bed.  Cough or sneeze.  Do physical activity. Follow these instructions at home:   Watch your condition for any changes.  When the pain starts, relax. Then try any of these methods to help with the pain: ? Sitting down. ? Flexing your knees up to your abdomen. ? Lying on your side with one pillow under your abdomen and another pillow between your legs. ? Sitting in a warm bath for 15-20 minutes or until the pain goes away.  Take over-the-counter and prescription medicines only as told by your health care provider.  Move slowly when you sit down or stand up.  Avoid long walks if they cause pain.  Stop or reduce your physical activities if they cause pain.  Keep all follow-up visits as told by your health care provider. This is important. Contact a health care provider if:  Your pain does not go away with treatment.  You feel pain in your back that you did not have before.  Your medicine is not helping. Get help right away if:  You have a fever or chills.  You develop uterine contractions.  You have vaginal bleeding.  You have nausea or vomiting.  You have diarrhea.  You have pain  when you urinate. Summary  Round ligament pain is felt in the lower abdomen or groin. It is usually a short, sharp, and pinching pain. It can also be a dull, lingering, and aching pain.  This pain usually begins in the second trimester (13-28 weeks). It occurs because the uterus is stretching with the growing baby, and it is not harmful to the baby.  You may notice the pain when you suddenly change position, when you cough or sneeze, or during physical activity.  Relaxing, flexing your knees to your abdomen, lying on one side, or taking a warm bath may help to get rid of the pain.  Get help from your health care provider if the pain does not go away or if you have vaginal bleeding, nausea, vomiting, diarrhea, or painful urination. This information is not intended to replace advice given to you by your health care provider. Make sure you discuss any questions you have with your health care provider. Document Released: 10/11/2007 Document Revised: 06/19/2017 Document Reviewed: 06/19/2017 Elsevier Interactive Patient Education  2019 [place]. Abdominal Pain During Pregnancy  Belly (abdominal) pain is common during pregnancy. There are many possible causes. Most of the time, it is not a serious problem. Other times, it can be a sign that something is wrong with the pregnancy. Always tell your doctor if you have belly pain. Follow these  instructions at home:  Do not have sex or put anything in your vagina until your pain goes away completely.  Get plenty of rest until your pain gets better.  Drink enough fluid to keep your pee (urine) pale yellow.  Take over-the-counter and prescription medicines only as told by your doctor.  Keep all follow-up visits as told by your doctor. This is important. Contact a doctor if:  Your pain continues or gets worse after resting.  You have lower belly pain that: ? Comes and goes at regular times. ? Spreads to your back. ? Feels like menstrual  cramps.  You have pain or burning when you pee (urinate). Get help right away if:  You have a fever or chills.  You have vaginal bleeding.  You are leaking fluid from your vagina.  You are passing tissue from your vagina.  You throw up (vomit) for more than 24 hours.  You have watery poop (diarrhea) for more than 24 hours.  Your baby is moving less than usual.  You feel very weak or faint.  You have shortness of breath.  You have very bad pain in your upper belly. Summary  Belly (abdominal) pain is common during pregnancy. There are many possible causes.  If you have belly pain during pregnancy, tell your doctor right away.  Keep all follow-up visits as told by your doctor. This is important. This information is not intended to replace advice given to you by your health care provider. Make sure you discuss any questions you have with your health care provider. Document Released: 12/20/2008 Document Revised: 04/05/2016 Document Reviewed: 04/05/2016 Elsevier Interactive Patient Education  2019 [place] for Pregnant Women While you are pregnant, your body requires additional nutrition to help support your growing baby. You also have a higher need for some vitamins and minerals, such as folic acid, calcium, iron, and vitamin D. Eating a healthy, well-balanced diet is very important for your health and your baby's health. Your need for extra calories varies for the three 21-monthsegments of your pregnancy (trimesters). For most women, it is recommended to consume:  150 extra calories a day during the first trimester.  300 extra calories a day during the second trimester.  300 extra calories a day during the third trimester. What are tips for following this plan?   Do not try to lose weight or go on a diet during pregnancy.  Limit your overall intake of foods that have "empty calories." These are foods that have little nutritional value, such as sweets,  desserts, candies, and sugar-sweetened beverages.  Eat a variety of foods (especially fruits and vegetables) to get a full range of vitamins and minerals.  Take a prenatal vitamin to help meet your additional vitamin and mineral needs during pregnancy, specifically for folic acid, iron, calcium, and vitamin D.  Remember to stay active. Ask your health care provider what types of exercise and activities are safe for you.  Practice good food safety and cleanliness. Wash your hands before you eat and after you prepare raw meat. Wash all fruits and vegetables well before peeling or eating. Taking these actions can help to prevent food-borne illnesses that can be very dangerous to your baby, such as listeriosis. Ask your health care provider for more information about listeriosis. What does 150 extra calories look like? Healthy options that provide 150 extra calories each day could be any of the following:  6-8 oz (170-230 g) of plain low-fat yogurt with  cup  of berries.  1 apple with 2 teaspoons (11 g) of peanut butter.  Cut-up vegetables with  cup (60 g) of hummus.  8 oz (230 mL) or 1 cup of low-fat chocolate milk.  1 stick of string cheese with 1 medium orange.  1 peanut butter and jelly sandwich that is made with one slice of whole-wheat bread and 1 tsp (5 g) of peanut butter. For 300 extra calories, you could eat two of those healthy options each day. What is a healthy amount of weight to gain? The right amount of weight gain for you is based on your BMI before you became pregnant. If your BMI:  Was less than 18 (underweight), you should gain 28-40 lb (13-18 kg).  Was 18-24.9 (normal), you should gain 25-35 lb (11-16 kg).  Was 25-29.9 (overweight), you should gain 15-25 lb (7-11 kg).  Was 30 or greater (obese), you should gain 11-20 lb (5-9 kg). What if I am having twins or multiples? Generally, if you are carrying twins or multiples:  You may need to eat 300-600 extra calories  a day.  The recommended range for total weight gain is 25-54 lb (11-25 kg), depending on your BMI before pregnancy.  Talk with your health care provider to find out about nutritional needs, weight gain, and exercise that is right for you. What foods can I eat?  Grains All grains. Choose whole grains, such as whole-wheat bread, oatmeal, or brown rice. Vegetables All vegetables. Eat a variety of colors and types of vegetables. Remember to wash your vegetables well before peeling or eating. Fruits All fruits. Eat a variety of colors and types of fruit. Remember to wash your fruits well before peeling or eating. Meats and other protein foods Lean meats, including chicken, [place], fish, and lean cuts of beef, veal, or pork. If you eat fish or seafood, choose options that are higher in omega-3 fatty acids and lower in mercury, such as salmon, herring, mussels, trout, sardines, pollock, shrimp, crab, and lobster. Tofu. Tempeh. Beans. Eggs. Peanut butter and other nut butters. Make sure that all meats, poultry, and eggs are cooked to food-safe temperatures or "well-done." Two or more servings of fish are recommended each week in order to get the most benefits from omega-3 fatty acids that are found in seafood. Choose fish that are lower in mercury. You can find more information online:  [employer] Dairy Pasteurized milk and milk alternatives (such as almond milk). Pasteurized yogurt and pasteurized cheese. Cottage cheese. Sour cream. Beverages Water. Juices that contain 100% fruit juice or vegetable juice. Caffeine-free teas and decaffeinated coffee. Drinks that contain caffeine are okay to drink, but it is better to avoid caffeine. Keep your total caffeine intake to less than 200 mg each day (which is 12 oz or 355 mL of coffee, tea, or soda) or the limit as told by your health care provider. Fats and oils Fats and oils are okay to include in moderation. Sweets and desserts Sweets and desserts are  okay to include in moderation. Seasoning and other foods All pasteurized condiments. The items listed above may not be a complete list of recommended foods and beverages. Contact your dietitian for more options. What foods are not recommended? Vegetables Raw (unpasteurized) vegetable juices. Fruits Unpasteurized fruit juices. Meats and other protein foods Lunch meats, bologna, hot dogs, or other deli meats. (If you must eat those meats, reheat them until they are steaming hot.) Refrigerated pat, meat spreads from a meat counter, smoked seafood that is found  in the refrigerated section of a store. Raw or undercooked meats, poultry, and eggs. Raw fish, such as sushi or sashimi. Fish that have high mercury content, such as tilefish, shark, swordfish, and king mackerel. To learn more about mercury in fish, talk with your health care provider or look for online resources, such as:  [employer] Dairy Raw (unpasteurized) milk and any foods that have raw milk in them. Soft cheeses, such as feta, queso blanco, queso fresco, Brie, Camembert cheeses, blue-veined cheeses, and Panela cheese (unless it is made with pasteurized milk, which must be stated on the label). Beverages Alcohol. Sugar-sweetened beverages, such as sodas, teas, or energy drinks. Seasoning and other foods Homemade fermented foods and drinks, such as pickles, sauerkraut, or kombucha drinks. (Store-bought pasteurized versions of these are okay.) Salads that are made in a store or deli, such as ham salad, chicken salad, egg salad, tuna salad, and seafood salad. The items listed above may not be a complete list of foods and beverages to avoid. Contact your dietitian for more information. Where to find more information To calculate the number of calories you need based on your height, weight, and activity level, you can use an online calculator such as:  MobileTransition.ch To calculate how much weight you should gain  during pregnancy, you can use an online pregnancy weight gain calculator such as:  StreamingFood.com.cy Summary  While you are pregnant, your body requires additional nutrition to help support your growing baby.  Eat a variety of foods, especially fruits and vegetables to get a full range of vitamins and minerals.  Practice good food safety and cleanliness. Wash your hands before you eat and after you prepare raw meat. Wash all fruits and vegetables well before peeling or eating. Taking these actions can help to prevent food-borne illnesses, such as listeriosis, that can be very dangerous to your baby.  Do not eat raw meat or fish. Do not eat fish that have high mercury content, such as tilefish, shark, swordfish, and king mackerel. Do not eat unpasteurized (raw) dairy.  Take a prenatal vitamin to help meet your additional vitamin and mineral needs during pregnancy, specifically for folic acid, iron, calcium, and vitamin D. This information is not intended to replace advice given to you by your health care provider. Make sure you discuss any questions you have with your health care provider. Document Released: 10/16/2013 Document Revised: 09/28/2016 Document Reviewed: 09/28/2016 Elsevier Interactive Patient Education  2019 [employer] Inc. Morning Sickness  Morning sickness is when you feel sick to your stomach (nauseous) during pregnancy. You may feel sick to your stomach and throw up (vomit). You may feel sick in the morning, but you can feel this way at any time of day. Some women feel very sick to their stomach and cannot stop throwing up (hyperemesis gravidarum). Follow these instructions at home: Medicines  Take over-the-counter and prescription medicines only as told by your doctor. Do not take any medicines until you talk with your doctor about them first.  Taking multivitamins before getting pregnant can stop or lessen the harshness of morning  sickness. Eating and drinking  Eat dry toast or crackers before getting out of bed.  Eat 5 or 6 small meals a day.  Eat dry and bland foods like rice and baked potatoes.  Do not eat greasy, fatty, or spicy foods.  Have someone cook for you if the smell of food causes you to feel sick or throw up.  If you feel sick to your stomach after  taking prenatal vitamins, take them at night or with a snack.  Eat protein when you need a snack. Nuts, yogurt, and cheese are good choices.  Drink fluids throughout the day.  Try ginger ale made with real ginger, ginger tea made from fresh grated ginger, or ginger candies. General instructions  Do not use any products that have nicotine or tobacco in them, such as cigarettes and e-cigarettes. If you need help quitting, ask your doctor.  Use an air purifier to keep the air in your house free of smells.  Get lots of fresh air.  Try to avoid smells that make you feel sick.  Try: ? Wearing a bracelet that is used for seasickness (acupressure wristband). ? Going to a doctor who puts thin needles into certain body points (acupuncture) to improve how you feel. Contact a doctor if:  You need medicine to feel better.  You feel dizzy or light-headed.  You are losing weight. Get help right away if:  You feel very sick to your stomach and cannot stop throwing up.  You pass out (faint).  You have very bad pain in your belly. Summary  Morning sickness is when you feel sick to your stomach (nauseous) during pregnancy.  You may feel sick in the morning, but you can feel this way at any time of day.  Making some changes to what you eat may help your symptoms go away. This information is not intended to replace advice given to you by your health care provider. Make sure you discuss any questions you have with your health care provider. Document Released: 02/09/2004 Document Revised: 02/02/2016 Document Reviewed: 02/02/2016 Elsevier Interactive  Patient Education  2019 [employer]. Common Medications Safe in Pregnancy  Acne:      Constipation:  Benzoyl Peroxide     Colace  Clindamycin      Dulcolax Suppository  Topica Erythromycin     Fibercon  Salicylic Acid      Metamucil         Miralax AVOID:        Senakot   Accutane    Cough:  Retin-A       Cough Drops  Tetracycline      Phenergan w/ Codeine if Rx  Minocycline      Robitussin (Plain & DM)  Antibiotics:     Crabs/Lice:  Ceclor       RID  Cephalosporins    AVOID:  E-Mycins      Kwell  Keflex  Macrobid/Macrodantin   Diarrhea:  Penicillin      Kao-Pectate  Zithromax      Imodium AD         PUSH FLUIDS AVOID:       Cipro     Fever:  Tetracycline      Tylenol (Regular or Extra  Minocycline       Strength)  Levaquin      Extra Strength-Do not          Exceed 8 tabs/24 hrs Caffeine:        <273m/day (equiv. To 1 cup of coffee or  approx. 3 12 oz sodas)         Gas: Cold/Hayfever:       Gas-X  Benadryl      Mylicon  Claritin       Phazyme  **Claritin-D        Chlor-Trimeton    Headaches:  Dimetapp      ASA-Free Excedrin  Drixoral-Non-Drowsy  Cold Compress  Mucinex (Guaifenasin)     Tylenol (Regular or Extra  Sudafed/Sudafed-12 Hour     Strength)  **Sudafed PE Pseudoephedrine   Tylenol Cold & Sinus     Vicks Vapor Rub  Zyrtec  **AVOID if Problems With Blood Pressure         Heartburn: Avoid lying down for at least 1 hour after meals  Aciphex      Maalox     Rash:  Milk of Magnesia     Benadryl    Mylanta       1% Hydrocortisone Cream  Pepcid  Pepcid Complete   Sleep Aids:  Prevacid      Ambien   Prilosec       Benadryl  Rolaids       Chamomile Tea  Tums (Limit 4/day)     Unisom  Zantac       Tylenol PM         Warm milk-add vanilla or  Hemorrhoids:       Sugar for taste  Anusol/Anusol H.C.  (RX: Analapram 2.5%)  Sugar Substitutes:  Hydrocortisone OTC     Ok in moderation  Preparation H      Tucks        Vaseline lotion applied to  tissue with wiping    Herpes:     Throat:  Acyclovir      Oragel  Famvir  Valtrex     Vaccines:         Flu Shot Leg Cramps:       *Gardasil  Benadryl      Hepatitis A         Hepatitis B Nasal Spray:       Pneumovax  Saline Nasal Spray     Polio Booster         Tetanus Nausea:       Tuberculosis test or PPD  Vitamin B6 25 mg TID   AVOID:    Dramamine      *Gardasil  Emetrol       Live Poliovirus  Ginger Root 250 mg QID    MMR (measles, mumps &  High Complex Carbs @ Bedtime    rebella)  Sea Bands-Accupressure    Varicella (Chickenpox)  Unisom 1/2 tab TID     *No known complications           If received before Pain:         Known pregnancy;   Darvocet       Resume series after  Lortab        Delivery  Percocet    Yeast:   Tramadol      Femstat  Tylenol 3      Gyne-lotrimin  Ultram       Monistat  Vicodin           MISC:         All Sunscreens           Hair Coloring/highlights          Insect Repellant's          (Including DEET)         Mystic Tans First Trimester of Pregnancy  The first trimester of pregnancy is from week 1 until the end of week 13 (months 1 through 3). During this time, your baby will begin to develop inside you. At 6-8 weeks, the eyes and face are formed, and the heartbeat can be seen on ultrasound. At the end  of 12 weeks, all the baby's organs are formed. Prenatal care is all the medical care you receive before the birth of your baby. Make sure you get good prenatal care and follow all of your doctor's instructions. Follow these instructions at home: Medicines  Take over-the-counter and prescription medicines only as told by your doctor. Some medicines are safe and some medicines are not safe during pregnancy.  Take a prenatal vitamin that contains at least 600 micrograms (mcg) of folic acid.  If you have trouble pooping (constipation), take medicine that will make your stool soft (stool softener) if your doctor approves. Eating and drinking    Eat regular, healthy meals.  Your doctor will tell you the amount of weight gain that is right for you.  Avoid raw meat and uncooked cheese.  If you feel sick to your stomach (nauseous) or throw up (vomit): ? Eat 4 or 5 small meals a day instead of 3 large meals. ? Try eating a few soda crackers. ? Drink liquids between meals instead of during meals.  To prevent constipation: ? Eat foods that are high in fiber, like fresh fruits and vegetables, whole grains, and beans. ? Drink enough fluids to keep your pee (urine) clear or pale yellow. Activity  Exercise only as told by your doctor. Stop exercising if you have cramps or pain in your lower belly (abdomen) or low back.  Do not exercise if it is too hot, too humid, or if you are in a place of great height (high altitude).  Try to avoid standing for long periods of time. Move your legs often if you must stand in one place for a long time.  Avoid heavy lifting.  Wear low-heeled shoes. Sit and stand up straight.  You can have sex unless your doctor tells you not to. Relieving pain and discomfort  Wear a good support bra if your breasts are sore.  Take warm water baths (sitz baths) to soothe pain or discomfort caused by hemorrhoids. Use hemorrhoid cream if your doctor says it is okay.  Rest with your legs raised if you have leg cramps or low back pain.  If you have puffy, bulging veins (varicose veins) in your legs: ? Wear support hose or compression stockings as told by your doctor. ? Raise (elevate) your feet for 15 minutes, 3-4 times a day. ? Limit salt in your food. Prenatal care  Schedule your prenatal visits by the twelfth week of pregnancy.  Write down your questions. Take them to your prenatal visits.  Keep all your prenatal visits as told by your doctor. This is important. Safety  Wear your seat belt at all times when driving.  Make a list of emergency phone numbers. The list should include numbers for family,  friends, the hospital, and police and fire departments. General instructions  Ask your doctor for a referral to a local prenatal class. Begin classes no later than at the start of month 6 of your pregnancy.  Ask for help if you need counseling or if you need help with nutrition. Your doctor can give you advice or tell you where to go for help.  Do not use hot tubs, steam rooms, or saunas.  Do not douche or use tampons or scented sanitary pads.  Do not cross your legs for long periods of time.  Avoid all herbs and alcohol. Avoid drugs that are not approved by your doctor.  Do not use any tobacco products, including cigarettes, chewing tobacco, and electronic cigarettes.  If you need help quitting, ask your doctor. You may get counseling or other support to help you quit.  Avoid cat litter boxes and soil used by cats. These carry germs that can cause birth defects in the baby and can cause a loss of your baby (miscarriage) or stillbirth.  Visit your dentist. At home, brush your teeth with a soft toothbrush. Be gentle when you floss. Contact a doctor if:  You are dizzy.  You have mild cramps or pressure in your lower belly.  You have a nagging pain in your belly area.  You continue to feel sick to your stomach, you throw up, or you have watery poop (diarrhea).  You have a bad smelling fluid coming from your vagina.  You have pain when you pee (urinate).  You have increased puffiness (swelling) in your face, hands, legs, or ankles. Get help right away if:  You have a fever.  You are leaking fluid from your vagina.  You have spotting or bleeding from your vagina.  You have very bad belly cramping or pain.  You gain or lose weight rapidly.  You throw up blood. It may look like coffee grounds.  You are around people who have [place] measles, fifth disease, or chickenpox.  You have a very bad headache.  You have shortness of breath.  You have any kind of trauma, such as  from a fall or a car accident. Summary  The first trimester of pregnancy is from week 1 until the end of week 13 (months 1 through 3).  To take care of yourself and your unborn baby, you will need to eat healthy meals, take medicines only if your doctor tells you to do so, and do activities that are safe for you and your baby.  Keep all follow-up visits as told by your doctor. This is important as your doctor will have to ensure that your baby is healthy and growing well. This information is not intended to replace advice given to you by your health care provider. Make sure you discuss any questions you have with your health care provider. Document Released: 06/20/2007 Document Revised: 01/10/2016 Document Reviewed: 01/10/2016 Elsevier Interactive Patient Education  2019 [employer].

## 2018-06-24 NOTE — Progress Notes (Signed)
PT is present today for confirmation of pregnancy. Pt LMP 04/27/18. UPT done today results were positive. This will be the pt's 3rd pregnancy. Pt stated that she is doing well no complaints.

## 2018-07-07 ENCOUNTER — Telehealth: Payer: Self-pay

## 2018-07-07 NOTE — Progress Notes (Signed)
[employer] presents for [place] interview visit. Pregnancy confirmation done [facility] 06/24/18 ML.  G-3 .  P-2 0 0 2    . Pregnancy education material explained and given. 0 cats in the home. NOB labs ordered. TSH/HbgA1c due to Increased BMI. HIV labs and Drug screen were explained optional and she did not decline. Drug screen ordered. PNV encouraged. Genetic screening options discussed. Genetic testing: Ordered/Declined/Unsure.  Pt may discuss with provider. Pt. To follow up with provider in 4 weeks for NOB physical and genetic testing. All questions answered.

## 2018-07-07 NOTE — Telephone Encounter (Signed)
Coronavirus (COVID-19) Are you at risk?  Are you at risk for the Coronavirus (COVID-19)?  To be considered HIGH RISK for Coronavirus (COVID-19), you have to meet the following criteria:  . Traveled to [place], [place], [place], [place] or [place]; or in the [place] to [place], [place], [place], or [place]; and have fever, cough, and shortness of breath within the last 2 weeks of travel OR . Been in close contact with a person diagnosed with COVID-19 within the last 2 weeks and have fever, cough, and shortness of breath . IF YOU DO NOT MEET THESE CRITERIA, YOU ARE CONSIDERED LOW RISK FOR COVID-19.  What to do if you are HIGH RISK for COVID-19?  . If you are having a medical emergency, call 911. . Seek medical care right away. Before you go to a doctor's office, urgent care or emergency department, call ahead and tell them about your recent travel, contact with someone diagnosed with COVID-19, and your symptoms. You should receive instructions from your physician's office regarding next steps of care.  . When you arrive at healthcare provider, tell the healthcare staff immediately you have returned from visiting [place], [place], [place], [place] or [place]; or traveled in the [place] to [place], [place], [place], or [place]; in the last two weeks or you have been in close contact with a person diagnosed with COVID-19 in the last 2 weeks.   . Tell the health care staff about your symptoms: fever, cough and shortness of breath. . After you have been seen by a medical provider, you will be either: o Tested for (COVID-19) and discharged home on quarantine except to seek medical care if symptoms worsen, and asked to  - Stay home and avoid contact with others until you get your results (4-5 days)  - Avoid travel on public transportation if possible (such as bus, train, or airplane) or o Sent to the Emergency Department by EMS for evaluation, COVID-19 testing, and possible  admission depending on your condition and test results.  What to do if you are LOW RISK for COVID-19?  Reduce your risk of any infection by using the same precautions used for avoiding the common cold or flu:  . Wash your hands often with soap and warm water for at least 20 seconds.  If soap and water are not readily available, use an alcohol-based hand sanitizer with at least 60% alcohol.  . If coughing or sneezing, cover your mouth and nose by coughing or sneezing into the elbow areas of your shirt or coat, into a tissue or into your sleeve (not your hands). . Avoid shaking hands with others and consider head nods or verbal greetings only. . Avoid touching your eyes, nose, or mouth with unwashed hands.  . Avoid close contact with people who are sick. . Avoid places or events with large numbers of people in one location, like concerts or sporting events. . Carefully consider travel plans you have or are making. . If you are planning any travel outside or inside the [place], visit the CDC's Travelers' Health webpage for the latest health notices. . If you have some symptoms but not all symptoms, continue to monitor at home and seek medical attention if your symptoms worsen. . If you are having a medical emergency, call 911.   ADDITIONAL HEALTHCARE OPTIONS FOR PATIENTS  [facility] / e-Visit: [email or web address]         [facility]: [phone]  [place]   [facility]: [phone]                   [facility]: [phone]   Prescreened. Neg .cm 

## 2018-07-08 ENCOUNTER — Ambulatory Visit: Payer: Medicaid Other | Admitting: Certified Nurse Midwife

## 2018-07-08 ENCOUNTER — Ambulatory Visit (INDEPENDENT_AMBULATORY_CARE_PROVIDER_SITE_OTHER): Payer: Medicaid Other

## 2018-07-08 ENCOUNTER — Other Ambulatory Visit: Payer: Self-pay

## 2018-07-08 VITALS — BP 116/73 | HR 63 | Ht 68.0 in | Wt 326.1 lb

## 2018-07-08 DIAGNOSIS — Z3687 Encounter for antenatal screening for uncertain dates: Secondary | ICD-10-CM | POA: Diagnosis not present

## 2018-07-08 DIAGNOSIS — R102 Pelvic and perineal pain: Secondary | ICD-10-CM | POA: Diagnosis not present

## 2018-07-08 DIAGNOSIS — R1011 Right upper quadrant pain: Secondary | ICD-10-CM

## 2018-07-08 DIAGNOSIS — N926 Irregular menstruation, unspecified: Secondary | ICD-10-CM

## 2018-07-08 DIAGNOSIS — Z3481 Encounter for supervision of other normal pregnancy, first trimester: Secondary | ICD-10-CM

## 2018-07-08 LAB — OB RESULTS CONSOLE GC/CHLAMYDIA: Gonorrhea: NEGATIVE

## 2018-07-08 LAB — OB RESULTS CONSOLE VARICELLA ZOSTER ANTIBODY, IGG: Varicella: NON-IMMUNE/NOT IMMUNE

## 2018-07-08 NOTE — Progress Notes (Signed)
I have reviewed the record and concur with patient management and plan of care.    Diona Fanti, CNM [facility], [facility] 07/08/18 2:07 PM

## 2018-07-09 ENCOUNTER — Encounter: Payer: Self-pay | Admitting: Family Medicine

## 2018-07-09 ENCOUNTER — Encounter: Payer: Self-pay | Admitting: Certified Nurse Midwife

## 2018-07-09 DIAGNOSIS — O09899 Supervision of other high risk pregnancies, unspecified trimester: Secondary | ICD-10-CM | POA: Insufficient documentation

## 2018-07-09 DIAGNOSIS — Z2839 Other underimmunization status: Secondary | ICD-10-CM | POA: Insufficient documentation

## 2018-07-09 LAB — MONITOR DRUG PROFILE 14(MW)
Amphetamine Scrn, Ur: NEGATIVE ng/mL
BARBITURATE SCREEN URINE: NEGATIVE ng/mL
BENZODIAZEPINE SCREEN, URINE: NEGATIVE ng/mL
Buprenorphine, Urine: NEGATIVE ng/mL
CANNABINOIDS UR QL SCN: NEGATIVE ng/mL
Cocaine (Metab) Scrn, Ur: NEGATIVE ng/mL
Creatinine(Crt), U: 168.3 mg/dL (ref 20.0–300.0)
Fentanyl, Urine: NEGATIVE pg/mL
Meperidine Screen, Urine: NEGATIVE ng/mL
Methadone Screen, Urine: NEGATIVE ng/mL
OXYCODONE+OXYMORPHONE UR QL SCN: NEGATIVE ng/mL
Opiate Scrn, Ur: NEGATIVE ng/mL
Ph of Urine: 8.1 (ref 4.5–8.9)
Phencyclidine Qn, Ur: NEGATIVE ng/mL
Propoxyphene Scrn, Ur: NEGATIVE ng/mL
SPECIFIC GRAVITY: 1.019
Tramadol Screen, Urine: NEGATIVE ng/mL

## 2018-07-09 LAB — URINALYSIS, ROUTINE W REFLEX MICROSCOPIC
Bilirubin, UA: NEGATIVE
Glucose, UA: NEGATIVE
Ketones, UA: NEGATIVE
Nitrite, UA: NEGATIVE
RBC, UA: NEGATIVE
Specific Gravity, UA: 1.024 (ref 1.005–1.030)
Urobilinogen, Ur: 0.2 mg/dL (ref 0.2–1.0)
pH, UA: 8.5 — ABNORMAL HIGH (ref 5.0–7.5)

## 2018-07-09 LAB — MICROSCOPIC EXAMINATION: Casts: NONE SEEN /lpf

## 2018-07-09 LAB — ANTIBODY SCREEN: Antibody Screen: NEGATIVE

## 2018-07-09 LAB — ABO AND RH: Rh Factor: POSITIVE

## 2018-07-09 LAB — RPR: RPR Ser Ql: NONREACTIVE

## 2018-07-09 LAB — RUBELLA SCREEN: Rubella Antibodies, IGG: 1.23 index (ref >0.99)

## 2018-07-09 LAB — HEMOGLOBIN A1C
Est. average glucose Bld gHb Est-mCnc: 105 mg/dL
Hgb A1c MFr Bld: 5.3 % (ref 4.8–5.6)

## 2018-07-09 LAB — TSH: TSH: 1.8 u[IU]/mL (ref 0.450–4.500)

## 2018-07-09 LAB — HIV ANTIBODY (ROUTINE TESTING W REFLEX): HIV Screen 4th Generation wRfx: NONREACTIVE

## 2018-07-09 LAB — VARICELLA ZOSTER ANTIBODY, IGG: Varicella zoster IgG: <135 index — ABNORMAL LOW (ref >165)

## 2018-07-09 LAB — HEPATITIS B SURFACE ANTIGEN: Hepatitis B Surface Ag: NEGATIVE

## 2018-07-10 LAB — URINE CULTURE

## 2018-07-10 LAB — GC/CHLAMYDIA PROBE AMP
Chlamydia trachomatis, NAA: NEGATIVE
Neisseria Gonorrhoeae by PCR: NEGATIVE

## 2018-07-22 ENCOUNTER — Encounter: Payer: Medicaid Other | Admitting: Obstetrics and Gynecology

## 2018-08-06 ENCOUNTER — Encounter: Payer: Self-pay | Admitting: Family Medicine

## 2018-08-07 ENCOUNTER — Encounter: Payer: Self-pay | Admitting: Obstetrics & Gynecology

## 2018-08-11 NOTE — Progress Notes (Signed)
Pt present for NOB PE. Pt stated having right side pain and lower back pain. Pt stated that she was doing well other than that.

## 2018-08-11 NOTE — Patient Instructions (Signed)
Second Trimester of Pregnancy  The second trimester is from week 14 through week 27 (month 4 through 6). This is often the time in pregnancy that you feel your best. Often times, morning sickness has lessened or quit. You may have more energy, and you may get hungry more often. Your unborn baby is growing rapidly. At the end of the sixth month, he or she is about 9 inches long and weighs about 1 pounds. You will likely feel the baby move between 18 and 20 weeks of pregnancy. Follow these instructions at home: Medicines  Take over-the-counter and prescription medicines only as told by your doctor. Some medicines are safe and some medicines are not safe during pregnancy.  Take a prenatal vitamin that contains at least 600 micrograms (mcg) of folic acid.  If you have trouble pooping (constipation), take medicine that will make your stool soft (stool softener) if your doctor approves. Eating and drinking   Eat regular, healthy meals.  Avoid raw meat and uncooked cheese.  If you get low calcium from the food you eat, talk to your doctor about taking a daily calcium supplement.  Avoid foods that are high in fat and sugars, such as fried and sweet foods.  If you feel sick to your stomach (nauseous) or throw up (vomit): ? Eat 4 or 5 small meals a day instead of 3 large meals. ? Try eating a few soda crackers. ? Drink liquids between meals instead of during meals.  To prevent constipation: ? Eat foods that are high in fiber, like fresh fruits and vegetables, whole grains, and beans. ? Drink enough fluids to keep your pee (urine) clear or pale yellow. Activity  Exercise only as told by your doctor. Stop exercising if you start to have cramps.  Do not exercise if it is too hot, too humid, or if you are in a place of great height (high altitude).  Avoid heavy lifting.  Wear low-heeled shoes. Sit and stand up straight.  You can continue to have sex unless your doctor tells you not to.  Relieving pain and discomfort  Wear a good support bra if your breasts are tender.  Take warm water baths (sitz baths) to soothe pain or discomfort caused by hemorrhoids. Use hemorrhoid cream if your doctor approves.  Rest with your legs raised if you have leg cramps or low back pain.  If you develop puffy, bulging veins (varicose veins) in your legs: ? Wear support hose or compression stockings as told by your doctor. ? Raise (elevate) your feet for 15 minutes, 3-4 times a day. ? Limit salt in your food. Prenatal care  Write down your questions. Take them to your prenatal visits.  Keep all your prenatal visits as told by your doctor. This is important. Safety  Wear your seat belt when driving.  Make a list of emergency phone numbers, including numbers for family, friends, the hospital, and police and fire departments. General instructions  Ask your doctor about the right foods to eat or for help finding a counselor, if you need these services.  Ask your doctor about local prenatal classes. Begin classes before month 6 of your pregnancy.  Do not use hot tubs, steam rooms, or saunas.  Do not douche or use tampons or scented sanitary pads.  Do not cross your legs for long periods of time.  Visit your dentist if you have not done so. Use a soft toothbrush to brush your teeth. Floss gently.  Avoid all smoking, herbs,  and alcohol. Avoid drugs that are not approved by your doctor.  Do not use any products that contain nicotine or tobacco, such as cigarettes and e-cigarettes. If you need help quitting, ask your doctor.  Avoid cat litter boxes and soil used by cats. These carry germs that can cause birth defects in the baby and can cause a loss of your baby (miscarriage) or stillbirth. Contact a doctor if:  You have mild cramps or pressure in your lower belly.  You have pain when you pee (urinate).  You have bad smelling fluid coming from your vagina.  You continue to feel  sick to your stomach (nauseous), throw up (vomit), or have watery poop (diarrhea).  You have a nagging pain in your belly area.  You feel dizzy. Get help right away if:  You have a fever.  You are leaking fluid from your vagina.  You have spotting or bleeding from your vagina.  You have severe belly cramping or pain.  You lose or gain weight rapidly.  You have trouble catching your breath and have chest pain.  You notice sudden or extreme puffiness (swelling) of your face, hands, ankles, feet, or legs.  You have not felt the baby move in over an hour.  You have severe headaches that do not go away when you take medicine.  You have trouble seeing. Summary  The second trimester is from week 14 through week 27 (months 4 through 6). This is often the time in pregnancy that you feel your best.  To take care of yourself and your unborn baby, you will need to eat healthy meals, take medicines only if your doctor tells you to do so, and do activities that are safe for you and your baby.  Call your doctor if you get sick or if you notice anything unusual about your pregnancy. Also, call your doctor if you need help with the right food to eat, or if you want to know what activities are safe for you. This information is not intended to replace advice given to you by your health care provider. Make sure you discuss any questions you have with your health care provider. Document Released: 03/28/2009 Document Revised: 04/25/2018 Document Reviewed: 02/07/2016 Elsevier Patient Education  [place]. Common Medications Safe in Pregnancy  Acne:      Constipation:  Benzoyl Peroxide     Colace  Clindamycin      Dulcolax Suppository  Topica Erythromycin     Fibercon  Salicylic Acid      Metamucil         Miralax AVOID:        Senakot   Accutane    Cough:  Retin-A       Cough Drops  Tetracycline      Phenergan w/ Codeine if Rx  Minocycline      Robitussin (Plain & DM)   Antibiotics:     Crabs/Lice:  Ceclor       RID  Cephalosporins    AVOID:  E-Mycins      Kwell  Keflex  Macrobid/Macrodantin   Diarrhea:  Penicillin      Kao-Pectate  Zithromax      Imodium AD         PUSH FLUIDS AVOID:       Cipro     Fever:  Tetracycline      Tylenol (Regular or Extra  Minocycline       Strength)  Levaquin      Extra Strength-Do  not          Exceed 8 tabs/24 hrs Caffeine:        <212m/day (equiv. To 1 cup of coffee or  approx. 3 12 oz sodas)         Gas: Cold/Hayfever:       Gas-X  Benadryl      Mylicon  Claritin       Phazyme  **Claritin-D        Chlor-Trimeton    Headaches:  Dimetapp      ASA-Free Excedrin  Drixoral-Non-Drowsy     Cold Compress  Mucinex (Guaifenasin)     Tylenol (Regular or Extra  Sudafed/Sudafed-12 Hour     Strength)  **Sudafed PE Pseudoephedrine   Tylenol Cold & Sinus     Vicks Vapor Rub  Zyrtec  **AVOID if Problems With Blood Pressure         Heartburn: Avoid lying down for at least 1 hour after meals  Aciphex      Maalox     Rash:  Milk of Magnesia     Benadryl    Mylanta       1% Hydrocortisone Cream  Pepcid  Pepcid Complete   Sleep Aids:  Prevacid      Ambien   Prilosec       Benadryl  Rolaids       Chamomile Tea  Tums (Limit 4/day)     Unisom  Zantac       Tylenol PM         Warm milk-add vanilla or  Hemorrhoids:       Sugar for taste  Anusol/Anusol H.C.  (RX: Analapram 2.5%)  Sugar Substitutes:  Hydrocortisone OTC     Ok in moderation  Preparation H      Tucks        Vaseline lotion applied to tissue with wiping    Herpes:     Throat:  Acyclovir      Oragel  Famvir  Valtrex     Vaccines:         Flu Shot Leg Cramps:       *Gardasil  Benadryl      Hepatitis A         Hepatitis B Nasal Spray:       Pneumovax  Saline Nasal Spray     Polio Booster         Tetanus Nausea:       Tuberculosis test or PPD  Vitamin B6 25 mg TID   AVOID:    Dramamine      *Gardasil  Emetrol       Live Poliovirus  Ginger  Root 250 mg QID    MMR (measles, mumps &  High Complex Carbs @ Bedtime    rebella)  Sea Bands-Accupressure    Varicella (Chickenpox)  Unisom 1/2 tab TID     *No known complications           If received before Pain:         Known pregnancy;   Darvocet       Resume series after  Lortab        Delivery  Percocet    Yeast:   Tramadol      Femstat  Tylenol 3      Gyne-lotrimin  Ultram       Monistat  Vicodin           MISC:         All Sunscreens  Hair Coloring/highlights          Insect Repellant's          (Including DEET)         Mystic Tans  Second Trimester of Pregnancy  The second trimester is from week 14 through week 27 (month 4 through 6). This is often the time in pregnancy that you feel your best. Often times, morning sickness has lessened or quit. You may have more energy, and you may get hungry more often. Your unborn baby is growing rapidly. At the end of the sixth month, he or she is about 9 inches long and weighs about 1 pounds. You will likely feel the baby move between 18 and 20 weeks of pregnancy. Follow these instructions at home: Medicines  Take over-the-counter and prescription medicines only as told by your doctor. Some medicines are safe and some medicines are not safe during pregnancy.  Take a prenatal vitamin that contains at least 600 micrograms (mcg) of folic acid.  If you have trouble pooping (constipation), take medicine that will make your stool soft (stool softener) if your doctor approves. Eating and drinking   Eat regular, healthy meals.  Avoid raw meat and uncooked cheese.  If you get low calcium from the food you eat, talk to your doctor about taking a daily calcium supplement.  Avoid foods that are high in fat and sugars, such as fried and sweet foods.  If you feel sick to your stomach (nauseous) or throw up (vomit): ? Eat 4 or 5 small meals a day instead of 3 large meals. ? Try eating a few soda crackers. ? Drink liquids between  meals instead of during meals.  To prevent constipation: ? Eat foods that are high in fiber, like fresh fruits and vegetables, whole grains, and beans. ? Drink enough fluids to keep your pee (urine) clear or pale yellow. Activity  Exercise only as told by your doctor. Stop exercising if you start to have cramps.  Do not exercise if it is too hot, too humid, or if you are in a place of great height (high altitude).  Avoid heavy lifting.  Wear low-heeled shoes. Sit and stand up straight.  You can continue to have sex unless your doctor tells you not to. Relieving pain and discomfort  Wear a good support bra if your breasts are tender.  Take warm water baths (sitz baths) to soothe pain or discomfort caused by hemorrhoids. Use hemorrhoid cream if your doctor approves.  Rest with your legs raised if you have leg cramps or low back pain.  If you develop puffy, bulging veins (varicose veins) in your legs: ? Wear support hose or compression stockings as told by your doctor. ? Raise (elevate) your feet for 15 minutes, 3-4 times a day. ? Limit salt in your food. Prenatal care  Write down your questions. Take them to your prenatal visits.  Keep all your prenatal visits as told by your doctor. This is important. Safety  Wear your seat belt when driving.  Make a list of emergency phone numbers, including numbers for family, friends, the hospital, and police and fire departments. General instructions  Ask your doctor about the right foods to eat or for help finding a counselor, if you need these services.  Ask your doctor about local prenatal classes. Begin classes before month 6 of your pregnancy.  Do not use hot tubs, steam rooms, or saunas.  Do not douche or use tampons or scented sanitary pads.  Do not cross your legs for long periods of time.  Visit your dentist if you have not done so. Use a soft toothbrush to brush your teeth. Floss gently.  Avoid all smoking, herbs, and  alcohol. Avoid drugs that are not approved by your doctor.  Do not use any products that contain nicotine or tobacco, such as cigarettes and e-cigarettes. If you need help quitting, ask your doctor.  Avoid cat litter boxes and soil used by cats. These carry germs that can cause birth defects in the baby and can cause a loss of your baby (miscarriage) or stillbirth. Contact a doctor if:  You have mild cramps or pressure in your lower belly.  You have pain when you pee (urinate).  You have bad smelling fluid coming from your vagina.  You continue to feel sick to your stomach (nauseous), throw up (vomit), or have watery poop (diarrhea).  You have a nagging pain in your belly area.  You feel dizzy. Get help right away if:  You have a fever.  You are leaking fluid from your vagina.  You have spotting or bleeding from your vagina.  You have severe belly cramping or pain.  You lose or gain weight rapidly.  You have trouble catching your breath and have chest pain.  You notice sudden or extreme puffiness (swelling) of your face, hands, ankles, feet, or legs.  You have not felt the baby move in over an hour.  You have severe headaches that do not go away when you take medicine.  You have trouble seeing. Summary  The second trimester is from week 14 through week 27 (months 4 through 6). This is often the time in pregnancy that you feel your best.  To take care of yourself and your unborn baby, you will need to eat healthy meals, take medicines only if your doctor tells you to do so, and do activities that are safe for you and your baby.  Call your doctor if you get sick or if you notice anything unusual about your pregnancy. Also, call your doctor if you need help with the right food to eat, or if you want to know what activities are safe for you. This information is not intended to replace advice given to you by your health care provider. Make sure you discuss any questions you  have with your health care provider. Document Released: 03/28/2009 Document Revised: 04/25/2018 Document Reviewed: 02/07/2016 Elsevier Patient Education  2020 [employer]. Common Medications Safe in Pregnancy  Acne:      Constipation:  Benzoyl Peroxide     Colace  Clindamycin      Dulcolax Suppository  Topica Erythromycin     Fibercon  Salicylic Acid      Metamucil         Miralax AVOID:        Senakot   Accutane    Cough:  Retin-A       Cough Drops  Tetracycline      Phenergan w/ Codeine if Rx  Minocycline      Robitussin (Plain & DM)  Antibiotics:     Crabs/Lice:  Ceclor       RID  Cephalosporins    AVOID:  E-Mycins      Kwell  Keflex  Macrobid/Macrodantin   Diarrhea:  Penicillin      Kao-Pectate  Zithromax      Imodium AD         PUSH FLUIDS AVOID:  Cipro     Fever:  Tetracycline      Tylenol (Regular or Extra  Minocycline       Strength)  Levaquin      Extra Strength-Do not          Exceed 8 tabs/24 hrs Caffeine:        <285m/day (equiv. To 1 cup of coffee or  approx. 3 12 oz sodas)         Gas: Cold/Hayfever:       Gas-X  Benadryl      Mylicon  Claritin       Phazyme  **Claritin-D        Chlor-Trimeton    Headaches:  Dimetapp      ASA-Free Excedrin  Drixoral-Non-Drowsy     Cold Compress  Mucinex (Guaifenasin)     Tylenol (Regular or Extra  Sudafed/Sudafed-12 Hour     Strength)  **Sudafed PE Pseudoephedrine   Tylenol Cold & Sinus     Vicks Vapor Rub  Zyrtec  **AVOID if Problems With Blood Pressure         Heartburn: Avoid lying down for at least 1 hour after meals  Aciphex      Maalox     Rash:  Milk of Magnesia     Benadryl    Mylanta       1% Hydrocortisone Cream  Pepcid  Pepcid Complete   Sleep Aids:  Prevacid      Ambien   Prilosec       Benadryl  Rolaids       Chamomile Tea  Tums (Limit 4/day)     Unisom  Zantac       Tylenol PM         Warm milk-add vanilla or  Hemorrhoids:       Sugar for taste  Anusol/Anusol H.C.  (RX:  Analapram 2.5%)  Sugar Substitutes:  Hydrocortisone OTC     Ok in moderation  Preparation H      Tucks        Vaseline lotion applied to tissue with wiping    Herpes:     Throat:  Acyclovir      Oragel  Famvir  Valtrex     Vaccines:         Flu Shot Leg Cramps:       *Gardasil  Benadryl      Hepatitis A         Hepatitis B Nasal Spray:       Pneumovax  Saline Nasal Spray     Polio Booster         Tetanus Nausea:       Tuberculosis test or PPD  Vitamin B6 25 mg TID   AVOID:    Dramamine      *Gardasil  Emetrol       Live Poliovirus  Ginger Root 250 mg QID    MMR (measles, mumps &  High Complex Carbs @ Bedtime    rebella)  Sea Bands-Accupressure    Varicella (Chickenpox)  Unisom 1/2 tab TID     *No known complications           If received before Pain:         Known pregnancy;   Darvocet       Resume series after  Lortab        Delivery  Percocet    Yeast:   Tramadol      Femstat  Tylenol 3  Gyne-lotrimin  Ultram       Monistat  Vicodin           MISC:         All Sunscreens           Hair Coloring/highlights          Insect Repellant's          (Including DEET)         Mystic Tans  

## 2018-08-12 ENCOUNTER — Telehealth: Payer: Self-pay | Admitting: Obstetrics and Gynecology

## 2018-08-12 ENCOUNTER — Ambulatory Visit (INDEPENDENT_AMBULATORY_CARE_PROVIDER_SITE_OTHER): Payer: Medicaid Other | Admitting: Obstetrics and Gynecology

## 2018-08-12 ENCOUNTER — Encounter: Payer: Self-pay | Admitting: Obstetrics and Gynecology

## 2018-08-12 ENCOUNTER — Other Ambulatory Visit: Payer: Self-pay

## 2018-08-12 VITALS — BP 129/81 | HR 63 | Wt 333.5 lb

## 2018-08-12 DIAGNOSIS — O99211 Obesity complicating pregnancy, first trimester: Secondary | ICD-10-CM

## 2018-08-12 DIAGNOSIS — O09899 Supervision of other high risk pregnancies, unspecified trimester: Secondary | ICD-10-CM

## 2018-08-12 DIAGNOSIS — Z283 Underimmunization status: Secondary | ICD-10-CM

## 2018-08-12 DIAGNOSIS — Z2839 Other underimmunization status: Secondary | ICD-10-CM

## 2018-08-12 DIAGNOSIS — Z1379 Encounter for other screening for genetic and chromosomal anomalies: Secondary | ICD-10-CM

## 2018-08-12 DIAGNOSIS — O09891 Supervision of other high risk pregnancies, first trimester: Secondary | ICD-10-CM

## 2018-08-12 DIAGNOSIS — Z3A13 13 weeks gestation of pregnancy: Secondary | ICD-10-CM

## 2018-08-12 DIAGNOSIS — O219 Vomiting of pregnancy, unspecified: Secondary | ICD-10-CM

## 2018-08-12 DIAGNOSIS — Z131 Encounter for screening for diabetes mellitus: Secondary | ICD-10-CM

## 2018-08-12 DIAGNOSIS — Z3482 Encounter for supervision of other normal pregnancy, second trimester: Secondary | ICD-10-CM

## 2018-08-12 LAB — POCT URINALYSIS DIPSTICK OB
Bilirubin, UA: NEGATIVE
Blood, UA: NEGATIVE
Glucose, UA: NEGATIVE
Ketones, UA: NEGATIVE
Nitrite, UA: NEGATIVE
Spec Grav, UA: >=1.03 — AB (ref 1.010–1.025)
Urobilinogen, UA: 0.2 E.U./dL
pH, UA: 6 (ref 5.0–8.0)

## 2018-08-12 MED ORDER — NYSTATIN 100000 UNIT/GM EX CREA: 1.0000 "application " | TOPICAL_CREAM | Freq: Two times a day (BID) | CUTANEOUS | 1 refills | Status: DC

## 2018-08-12 NOTE — Progress Notes (Signed)
OBSTETRIC INITIAL PRENATAL VISIT  Subjective:    Robin Cameron is being seen today for her first obstetrical visit.  This is a planned pregnancy. She is a 23 y.o. G20P2002 female at [redacted]w[redacted]d gestation, Estimated Date of Delivery: 02/16/19 with Patient's last menstrual period was 04/27/2018, inconsistent with 8 week sono. Her obstetrical history is significant for obesity. Relationship with FOB: spouse, living together. Patient does intend to breast feed. Pregnancy history fully reviewed.    OB History  Gravida Para Term Preterm AB Living  3 2 2  0 0 2  SAB TAB Ectopic Multiple Live Births  0 0 0 0 2    # Outcome Date GA Lbr Len/2nd Weight Sex Delivery Anes PTL Lv  3 Current           2 Term 2015 [redacted]w[redacted]d  9 lb 8 oz (4.309 kg) F Vag-Spont  N LIV  1 Term 08/09/12 [redacted]w[redacted]d 00:10 9 lb (4.082 kg) F Vag-Spont Local  LIV     Birth Comments: didn't know she was pregnant    Gynecologic History:  Last pap smear was 02/19/2017 .  Results were normal.  Denies h/o abnormal pap smears in the past.  Denies history of STIs.  Contraception: None  Past Medical History:  Diagnosis Date  . Allergic rhinitis   . Obesity     Family History  Problem Relation Age of Onset  . COPD Maternal Grandmother   . Hypertension Maternal Grandmother   . Hyperlipidemia Maternal Grandfather   . Hypertension Maternal Grandfather   . COPD Maternal Grandfather   . Diabetes Mother   . Diabetes Father     Past Surgical History:  Procedure Laterality Date  . NO PAST SURGERIES    . WISDOM TOOTH EXTRACTION      Social History   Socioeconomic History  . Marital status: Single    Spouse name: Not on file  . Number of children: Not on file  . Years of education: Not on file  . Highest education level: Not on file  Occupational History  . Not on file  Social Needs  . Financial resource strain: Not on file  . Food insecurity    Worry: Not on file    Inability: Not on file  . Transportation needs    Medical: Not on  file    Non-medical: Not on file  Tobacco Use  . Smoking status: Never Smoker  . Smokeless tobacco: Never Used  Substance and Sexual Activity  . Alcohol use: Not Currently    Comment: ocass  . Drug use: No  . Sexual activity: Yes    Comment: only one partner  Lifestyle  . Physical activity    Days per week: Not on file    Minutes per session: Not on file  . Stress: Not on file  Relationships  . Social Herbalist on phone: Not on file    Gets together: Not on file    Attends religious service: Not on file    Active member of club or organization: Not on file    Attends meetings of clubs or organizations: Not on file    Relationship status: Not on file  . Intimate partner violence    Fear of current or ex partner: Not on file    Emotionally abused: Not on file    Physically abused: Not on file    Forced sexual activity: Not on file  Other Topics Concern  . Not on file  Social  History Narrative  . Not on file    Current Outpatient Medications on File Prior to Visit  Medication Sig Dispense Refill  . folic acid (FOLVITE) 1 MG tablet Take 1 tablet (1 mg total) by mouth daily. 30 tablet 10  . Prenatal Vit-Fe Fumarate-FA (PRENATAL MULTIVITAMIN) TABS tablet Take 1 tablet by mouth daily at 12 noon.    . Doxylamine-Pyridoxine (DICLEGIS) 10-10 MG TBEC Take 2 tablets by mouth at bedtime. If symptoms persist, add one tablet in the morning and one in the afternoon (Patient not taking: Reported on 07/08/2018) 100 tablet 5   No current facility-administered medications on file prior to visit.     Allergies  Allergen Reactions  . Shrimp [Shellfish Allergy] Rash  . Amoxicillin Rash  . Mushroom Extract Complex Rash      Review of Systems General: Not Present- Fever, Weight Loss and Weight Gain. Skin: Not Present- Rash. HEENT: Not Present- Blurred Vision, Headache and Bleeding Gums. Respiratory: Not Present- Difficulty Breathing. Breast: Not Present- Breast Mass.  Cardiovascular: Not Present- Chest Pain, Elevated Blood Pressure, Fainting / Blacking Out and Shortness of Breath. Gastrointestinal: Not Present- Abdominal Pain, Constipation, Present - Nausea and Vomiting (however is improving). Female Genitourinary: Not Present- Frequency, Painful Urination, Pelvic Pain, Vaginal Bleeding, Vaginal Discharge, Contractions, regular, Fetal Movements Decreased, Urinary Complaints and Vaginal Fluid. Musculoskeletal: Not Present- Back Pain and Leg Cramps. Neurological: Not Present- Dizziness. Psychiatric: Not Present- Depression.     Objective:   Blood pressure 129/81, pulse 63, weight (!) 333 lb 8 oz (151.3 kg), last menstrual period 04/27/2018.  Body mass index is 50.71 kg/m.  General Appearance:    Alert, cooperative, no distress, appears stated age, morbidly obese  Head:    Normocephalic, without obvious abnormality, atraumatic  Eyes:    PERRL, conjunctiva/corneas clear, EOM's intact, both eyes  Ears:    Normal external ear canals, both ears  Nose:   Nares normal, septum midline, mucosa normal, no drainage or sinus tenderness  Throat:   Lips, mucosa, and tongue normal; teeth and gums normal  Neck:   Supple, symmetrical, trachea midline, no adenopathy; thyroid: no enlargement/tenderness/nodules; no carotid bruit or JVD  Back:     Symmetric, no curvature, ROM normal, no CVA tenderness  Lungs:     Clear to auscultation bilaterally, respirations unlabored  Chest Wall:    No tenderness or deformity   Heart:    Regular rate and rhythm, S1 and S2 normal, no murmur, rub or gallop  Breast Exam:    No tenderness, masses, or nipple abnormality  Abdomen:     Soft, non-tender, bowel sounds active all four quadrants, no masses, no organomegaly.  FHT 151 bpm.  Genitalia:    Pelvic:external genitalia normal, vagina without lesions, or tenderness. Small amount of yellow thick discharge present, no odor.  Rectovaginal septum  normal. Cervix normal in appearance, no cervical  motion tenderness, no adnexal masses or tenderness.  Uterus difficult to palpate due to body habitus.    Rectal:    Normal external sphincter.  No hemorrhoids appreciated. Internal exam not done.   Extremities:   Extremities normal, atraumatic, no cyanosis or edema  Pulses:   2+ and symmetric all extremities  Skin:   Skin color, texture, turgor normal, no rashes or lesions  Lymph nodes:   Cervical, supraclavicular, and axillary nodes normal  Neurologic:   CNII-XII intact, normal strength, sensation and reflexes throughout      Assessment:    Pregnancy at 13 and 1/7 weeks  Maternal varicella, non-immune Genetic screening  Morbid obesity with body mass index (BMI) of 50.0 to 59.9 in adult [facility](HCC) Nausea and vomiting of pregnancy  Plan:    Initial labs reviewed. Varicella non-immune, will need vaccination postpartum Prenatal vitamins encouraged. Also patient is trying to take an additional folic acid supplement daily. Problem list reviewed and updated. New OB counseling:  The patient has been given an overview regarding routine prenatal care.  Recommendations regarding diet, weight gain, and exercise in pregnancy were given. Patient has already gained 10 lbs since the start of pregnancy. Advised on no further weight gain. Will refer to Nutritionist. Patient notes that she has also started exercising more (walking with her daughters).  Prenatal testing, optional genetic testing, and ultrasound use in pregnancy were reviewed.  Patient desires cell-free DNA testing with MaterniT21.  Benefits of Breast Feeding were discussed. The patient is encouraged to consider nursing her baby post partum.  Anesthesia consult placed for morbid obesity in pregnancy.  Nausea and vomiting improving. Patient notes vomiting bile. Had recent ultrasound that ruled out gallstones, gallbladder or pancreatic issues.  The patient has Medicaid.  [facility] Medicaid Risk Screening Form completed today  Follow up in 4 weeks.   50% of 30 min visit spent on counseling and coordination of care.    Hildred Laserherry, Gustie Bobb, MD [facility]

## 2018-08-12 NOTE — Progress Notes (Signed)
NEW OB HISTORY AND PHYSICAL  SUBJECTIVE:       Robin Cameron is a 23 y.o. G88P2002 female, Patient's last menstrual period was 04/27/2018., Estimated Date of Delivery: 02/16/19, [redacted]w[redacted]d, presents today for establishment of Prenatal Care.  She's had a month of morning sickness that just improved- she was throwing up a lot. She is still nauseous, but she can eat well again. Also drinking water well. The Diclegis didn't help- actually seemed to make it worse. She is currently craving healthy things-  salad and fish.  Right-sided back pain for 1 day. Thinks this might be related to prenatal yoga. Thinks she may have pulled a muscle. She took some Tylenol, which did help.    She's been having more headaches than normal, typically at night time (but not every night). Thinks they're related to heat- ice pack on back of head helps. Possibly related to stress and dehydration. Tylenol also helps.  No issues with first pregnancies. Her girls just turned 6 and 5. With her first baby she didn't know she was pregnant until she gave birth, she presented to the ED and they had to break her water. Second baby she had a normal delivery with epidural. She'd like to try giving birth naturally, but will consider epidural if it hurts. She thinks that both pregnancies were full term.   Husband is concerned that US shows twins- would like [place] to take a look at ie.  Family history -Maternal grandpa - diabetes     Gynecologic History Patient's last menstrual period was 04/27/2018. Contraception: none, currently pregnant Menarche age: 38 History of STI's: Denies Last Pap: 02/16/19. Results were: normal.  Obstetric History OB History  Gravida Para Term Preterm AB Living  3 2 2     2   SAB TAB Ectopic Multiple Live Births          2    # Outcome Date GA Lbr Len/2nd Weight Sex Delivery Anes PTL Lv  3 Current           2 Term 2015 [redacted]w[redacted]d  4309 g F Vag-Spont  N LIV  1 Term 08/09/12 [redacted]w[redacted]d 00:10 4082 g F Vag-Spont Local  LIV      Birth Comments: didn't know she was pregnant    Past Medical History:  Diagnosis Date  . Allergic rhinitis   . Obesity     Past Surgical History:  Procedure Laterality Date  . NO PAST SURGERIES    . WISDOM TOOTH EXTRACTION      Current Outpatient Medications on File Prior to Visit  Medication Sig Dispense Refill  . folic acid (FOLVITE) 1 MG tablet Take 1 tablet (1 mg total) by mouth daily. 30 tablet 10  . Prenatal Vit-Fe Fumarate-FA (PRENATAL MULTIVITAMIN) TABS tablet Take 1 tablet by mouth daily at 12 noon.    . Doxylamine-Pyridoxine (DICLEGIS) 10-10 MG TBEC Take 2 tablets by mouth at bedtime. If symptoms persist, add one tablet in the morning and one in the afternoon (Patient not taking: Reported on 07/08/2018) 100 tablet 5   No current facility-administered medications on file prior to visit.     Allergies  Allergen Reactions  . Shrimp [Shellfish Allergy] Rash  . Amoxicillin Rash  . Mushroom Extract Complex Rash    Social History   Socioeconomic History  . Marital status: Single    Spouse name: Not on file  . Number of children: Not on file  . Years of education: Not on file  . Highest education level: Not  on file  Occupational History  . Not on file  Social Needs  . Financial resource strain: Not on file  . Food insecurity    Worry: Not on file    Inability: Not on file  . Transportation needs    Medical: Not on file    Non-medical: Not on file  Tobacco Use  . Smoking status: Never Smoker  . Smokeless tobacco: Never Used  Substance and Sexual Activity  . Alcohol use: Not Currently    Comment: ocass  . Drug use: No  . Sexual activity: Yes    Comment: only one partner  Lifestyle  . Physical activity    Days per week: Not on file    Minutes per session: Not on file  . Stress: Not on file  Relationships  . Social connections    Talks on phone: Not onMusician file    Gets together: Not on file    Attends religious service: Not on file    Active member of  club or organization: Not on file    Attends meetings of clubs or organizations: Not on file    Relationship status: Not on file  . Intimate partner violence    Fear of current or ex partner: Not on file    Emotionally abused: Not on file    Physically abused: Not on file    Forced sexual activity: Not on file  Other Topics Concern  . Not on file  Social History Narrative  . Not on file    Family History  Problem Relation Age of Onset  . COPD Maternal Grandmother   . Hypertension Maternal Grandmother   . Hyperlipidemia Maternal Grandfather   . Hypertension Maternal Grandfather   . COPD Maternal Grandfather   . Diabetes Mother   . Diabetes Father     The following portions of the patient's history were reviewed and updated as appropriate: allergies, current medications, past OB history, past medical history, past surgical history, past family history, past social history, and problem list.    OBJECTIVE: Initial Physical Exam (New OB)  GENERAL APPEARANCE: alert, well appearing HEAD: normocephalic, atraumatic MOUTH: grossly normal THYROID: no thyromegaly or masses present BREASTS: no masses noted, no significant tenderness, no palpable axillary nodes, no skin changes LUNGS: clear to auscultation, no wheezes, rales or rhonchi, symmetric air entry HEART: regular rate and rhythm, no murmurs ABDOMEN: soft, nontender, nondistended, no abnormal masses, no epigastric pain and obese EXTREMITIES: no redness or tenderness in the calves or thighs Back: right lower back tender to palpation. No bony tenderness.  SKIN: normal coloration and turgor, no rashes LYMPH NODES: deferred NEUROLOGIC: grossly normal PELVIC EXAM: EXTERNAL, INTENRAL GENITALIA: normal appearing vulva with no masses, tenderness or lesions.   ASSESSMENT:  Problem List Items Addressed This Visit      Other   Maternal varicella, non-immune    Other Visit Diagnoses    Encounter for supervision of other normal  pregnancy in second trimester    -  Primary   Relevant Orders   POC Urinalysis Dipstick OB (Completed)   Genetic screening       Relevant Orders   MaterniT21 PLUS Core+ESS+SCA   Morbid obesity with body mass index (BMI) of 50.0 to 59.9 in adult [facility](HCC)       Screening for diabetes mellitus       Relevant Orders   Glucose, 1 hour gestational      PLAN: For morning sickness, nausea is improving on its own. Pt is  gaining weight and hydrating well. Will continue to follow.  For rash under abdominal pannus, script sent for Nystatin cream, apply 1-2x daily. Also rec baby powder or gold bond powder.   For BMI >45 (currently 50.1), referral sent for anesthesia consult and nutrition consult. Anticipatory guidance provided regarding recommended weight gain. Rec healthy diet, exercise, minimize further weight gain. Continue prenatal yoga.  FHR not appreciated, likely due to body habitus. US technician available now, pt will head there now.  She is interested in genetic testing. MaterniT21 PLUS Core+ESS+SCA ordered.   1 hour glucose ordered.  F/u in 4 weeks for routine OB visit.  Ignacia BayleyLaura Pierra Skora, PA-S 08/12/18

## 2018-08-12 NOTE — Telephone Encounter (Signed)
The patient called and stated that she needs to speak with he nurse or provider on which specific code she needs to give [facility] in order to have her labs done Pt requesting call back, Unable to reach nurse or provider at time of call. Please advise.

## 2018-08-13 ENCOUNTER — Other Ambulatory Visit: Payer: Medicaid Other

## 2018-08-13 DIAGNOSIS — Z131 Encounter for screening for diabetes mellitus: Secondary | ICD-10-CM

## 2018-08-13 DIAGNOSIS — Z1379 Encounter for other screening for genetic and chromosomal anomalies: Secondary | ICD-10-CM

## 2018-08-13 NOTE — Telephone Encounter (Signed)
Called pt back and scheduled her for a lab appointment in the office.

## 2018-08-13 NOTE — Telephone Encounter (Signed)
Pt called to speak more about the information needed to get her blood work done. Pt stated that she was unsure about what code was needed. Pt was informed that I would look more into what information was needed.

## 2018-08-17 LAB — MATERNIT21  PLUS CORE+ESS+SCA, BLOOD

## 2018-08-17 LAB — GLUCOSE, 1 HOUR GESTATIONAL: Gestational Diabetes Screen: 108 mg/dL (ref 65–139)

## 2018-08-18 ENCOUNTER — Encounter: Payer: Self-pay | Admitting: Obstetrics and Gynecology

## 2018-08-26 ENCOUNTER — Other Ambulatory Visit: Payer: Self-pay

## 2018-08-26 ENCOUNTER — Encounter
Admission: RE | Admit: 2018-08-26 | Discharge: 2018-08-26 | Disposition: A | Discharge status: Home / Self Care | Payer: Medicaid Other | Source: Ambulatory Visit | Attending: Anesthesiology | Admitting: Anesthesiology

## 2018-08-26 NOTE — Consult Note (Signed)
[facility] Anesthesia Consultation  Daine Croker ZOX:[id number] DOB: July 30, 1995 DOA: 08/26/2018 PCP: Lorelee Market, MD   Requesting physician: Dr. Marcelline Mates Date of consultation: 08/26/18 Reason for consultation: Obesity during pregnancy  CHIEF COMPLAINT:  Obesity during pregnancy  HISTORY OF PRESENT ILLNESS: Robin Cameron  is a 23 y.o. female with a known history of obesity during pregnancy. This is her third pregnancy, has had 2 prior vaginal deliveries, the second one she had an epidural for labor analgesia. Denies any hx of asthma. Denies hx of cardiovascular disease.   PAST MEDICAL HISTORY:   Past Medical History:  Diagnosis Date  . Allergic rhinitis   . Obesity     PAST SURGICAL HISTORY:  Past Surgical History:  Procedure Laterality Date  . NO PAST SURGERIES    . WISDOM TOOTH EXTRACTION      SOCIAL HISTORY:  Social History   Tobacco Use  . Smoking status: Never Smoker  . Smokeless tobacco: Never Used  Substance Use Topics  . Alcohol use: Not Currently    Comment: ocass    FAMILY HISTORY:  Family History  Problem Relation Age of Onset  . COPD Maternal Grandmother   . Hypertension Maternal Grandmother   . Hyperlipidemia Maternal Grandfather   . Hypertension Maternal Grandfather   . COPD Maternal Grandfather   . Diabetes Mother   . Diabetes Father     DRUG ALLERGIES:  Allergies  Allergen Reactions  . Shrimp [Shellfish Allergy] Rash  . Amoxicillin Rash  . Mushroom Extract Complex Rash    REVIEW OF SYSTEMS:   RESPIRATORY: No cough, shortness of breath, wheezing.  CARDIOVASCULAR: No chest pain, orthopnea, edema.  HEMATOLOGY: No anemia, easy bruising or bleeding SKIN: No rash or lesion. NEUROLOGIC: No tingling, numbness, weakness.  PSYCHIATRY: No anxiety or depression.   MEDICATIONS AT HOME:  Prior to Admission medications   Medication Sig Start Date End Date Taking? Authorizing Provider  Doxylamine-Pyridoxine  (DICLEGIS) 10-10 MG TBEC Take 2 tablets by mouth at bedtime. If symptoms persist, add one tablet in the morning and one in the afternoon Patient not taking: Reported on 07/08/2018 06/24/18   Diona Fanti, CNM  folic acid (FOLVITE) 1 MG tablet Take 1 tablet (1 mg total) by mouth daily. 06/24/18   Lawhorn, Lara Mulch, CNM  nystatin cream (MYCOSTATIN) Apply 1 application topically 2 (two) times daily. 08/12/18   Rubie Maid, MD  Prenatal Vit-Fe Fumarate-FA (PRENATAL MULTIVITAMIN) TABS tablet Take 1 tablet by mouth daily at 12 noon.    [provider]      PHYSICAL EXAMINATION:   VITAL SIGNS: Last menstrual period 04/27/2018.  GENERAL:  23 y.o.-year-old patient no acute distress.  HEENT: Head atraumatic, normocephalic. Oropharynx and nasopharynx clear. MP 2, TM distance >3 cm, normal mouth opening, grade 1 upper lip bite. LUNGS: Normal breath sounds bilaterally, no wheezing, rales,rhonchi. No use of accessory muscles of respiration.  CARDIOVASCULAR: S1, S2 normal. No murmurs, rubs, or gallops.  EXTREMITIES: No pedal edema, cyanosis, or clubbing.  NEUROLOGIC: normal gait PSYCHIATRIC: The patient is alert and oriented x 3.  SKIN: No obvious rash, lesion, or ulcer.    IMPRESSION AND PLAN:   Robin Cameron  is a 23 y.o. female presenting with obesity during pregnancy. BMI is currently 50 at [redacted] weeks gestation.   Airway exam reassuring today. Significant back adiposity, interspaces minimally palpable. Pt has been advised to not gain weight during pregnancy.  We discussed analgesic options during labor including epidural analgesia. Discussed that in obesity  there can be increased difficulty with epidural placement or even failure of successful epidural. We also discussed that even after successful epidural placement there is increased risk of catheter migration out of the epidural space that would require catheter replacement. Discussed use of epidural vs spinal vs GA if  cesarean delivery is required. Discussed increased risk of difficult intubation during pregnancy should an emergency cesarean delivery be required.   We discussed repeat evaluation at 32-34 weeks by anesthesia to determine whether there is a high risk of complications of anesthesia for which we would recommend transfer of OB care to a facility with a higher maternal level of care designation.

## 2018-09-09 ENCOUNTER — Ambulatory Visit (INDEPENDENT_AMBULATORY_CARE_PROVIDER_SITE_OTHER): Payer: Medicaid Other | Admitting: Obstetrics and Gynecology

## 2018-09-09 ENCOUNTER — Telehealth: Payer: Self-pay | Admitting: Obstetrics and Gynecology

## 2018-09-09 ENCOUNTER — Other Ambulatory Visit: Payer: Self-pay

## 2018-09-09 ENCOUNTER — Encounter: Payer: Self-pay | Admitting: Obstetrics and Gynecology

## 2018-09-09 VITALS — BP 126/87 | HR 111 | Ht 68.0 in | Wt 332.7 lb

## 2018-09-09 DIAGNOSIS — Z3482 Encounter for supervision of other normal pregnancy, second trimester: Secondary | ICD-10-CM

## 2018-09-09 DIAGNOSIS — Z6841 Body Mass Index (BMI) 40.0 and over, adult: Secondary | ICD-10-CM

## 2018-09-09 NOTE — Telephone Encounter (Signed)
[facility] manager reviewed pt chart under login due to Epic issues. Thank you.

## 2018-09-09 NOTE — Progress Notes (Signed)
Patient comes in today for [place] visit. She is having right pelvic pressure and right leg pain.

## 2018-09-09 NOTE — Progress Notes (Signed)
ROB: No complaints.  Does report fetal movement.  She had a normal 1 hour GCT-repeat at 28 weeks.  FAS next visit.  Anesthesia consult done

## 2018-10-08 ENCOUNTER — Other Ambulatory Visit: Payer: Self-pay

## 2018-10-08 ENCOUNTER — Encounter: Payer: Self-pay | Admitting: Obstetrics and Gynecology

## 2018-10-08 ENCOUNTER — Ambulatory Visit (INDEPENDENT_AMBULATORY_CARE_PROVIDER_SITE_OTHER): Payer: Medicaid Other

## 2018-10-08 ENCOUNTER — Ambulatory Visit (INDEPENDENT_AMBULATORY_CARE_PROVIDER_SITE_OTHER): Payer: Medicaid Other | Admitting: Obstetrics and Gynecology

## 2018-10-08 VITALS — BP 134/82 | HR 90 | Wt 337.4 lb

## 2018-10-08 DIAGNOSIS — O09299 Supervision of pregnancy with other poor reproductive or obstetric history, unspecified trimester: Secondary | ICD-10-CM | POA: Insufficient documentation

## 2018-10-08 DIAGNOSIS — Z3482 Encounter for supervision of other normal pregnancy, second trimester: Secondary | ICD-10-CM | POA: Diagnosis not present

## 2018-10-08 DIAGNOSIS — O926 Galactorrhea: Secondary | ICD-10-CM

## 2018-10-08 DIAGNOSIS — R102 Pelvic and perineal pain: Secondary | ICD-10-CM

## 2018-10-08 DIAGNOSIS — O0992 Supervision of high risk pregnancy, unspecified, second trimester: Secondary | ICD-10-CM | POA: Diagnosis not present

## 2018-10-08 DIAGNOSIS — Z6841 Body Mass Index (BMI) 40.0 and over, adult: Secondary | ICD-10-CM

## 2018-10-08 LAB — POCT URINALYSIS DIPSTICK OB
Bilirubin, UA: NEGATIVE
Blood, UA: NEGATIVE
Glucose, UA: NEGATIVE
Ketones, UA: NEGATIVE
Leukocytes, UA: NEGATIVE
Nitrite, UA: NEGATIVE
POC,PROTEIN,UA: NEGATIVE
Spec Grav, UA: 1.025 (ref 1.010–1.025)
Urobilinogen, UA: 0.2 E.U./dL
pH, UA: 6 (ref 5.0–8.0)

## 2018-10-08 NOTE — Progress Notes (Signed)
ROB: Patient s/p normal anatomy scan today, incomplete today, will return for repeat in 3-4 weeks.  Noting right sided pelvic pain.  Discussed pain relieving measures.  Declines flu vaccine. Has had nutrition consult for BMI. Notes that she is doing well to cooperate with diet, but having difficulty with exercising due to right-sided pain as she has tried walking. Discussed other options (patient does note that she has tried her grandmother's exercise bike a few times without issues). Current BMI is 51. Discussed circumcision for female infant.  Declines flu vaccine. RTC in 4 weeks.

## 2018-10-08 NOTE — Progress Notes (Signed)
ROB-Pt present for u/s for anatomy scan and routine prenatal care.  Pt stated having right side pain in the pelvic area. Pt declined flu vaccine.

## 2018-10-08 NOTE — Patient Instructions (Signed)

## 2018-10-29 ENCOUNTER — Ambulatory Visit (INDEPENDENT_AMBULATORY_CARE_PROVIDER_SITE_OTHER): Payer: Medicaid Other

## 2018-10-29 DIAGNOSIS — Z362 Encounter for other antenatal screening follow-up: Secondary | ICD-10-CM | POA: Diagnosis not present

## 2018-10-29 DIAGNOSIS — O0992 Supervision of high risk pregnancy, unspecified, second trimester: Secondary | ICD-10-CM

## 2018-11-03 ENCOUNTER — Telehealth: Payer: Self-pay | Admitting: Obstetrics and Gynecology

## 2018-11-03 NOTE — Telephone Encounter (Signed)
The patient called and stated that she did not receive a disc from her anatomy and f/u anatomy scan. Pt is requesting a call back. Please advise.

## 2018-11-05 ENCOUNTER — Ambulatory Visit (INDEPENDENT_AMBULATORY_CARE_PROVIDER_SITE_OTHER): Payer: Medicaid Other | Admitting: Obstetrics and Gynecology

## 2018-11-05 ENCOUNTER — Other Ambulatory Visit: Payer: Self-pay

## 2018-11-05 ENCOUNTER — Encounter: Payer: Self-pay | Admitting: Obstetrics and Gynecology

## 2018-11-05 ENCOUNTER — Telehealth: Payer: Self-pay | Admitting: Obstetrics and Gynecology

## 2018-11-05 VITALS — BP 137/77 | HR 108 | Wt 344.8 lb

## 2018-11-05 DIAGNOSIS — B86 Scabies: Secondary | ICD-10-CM

## 2018-11-05 DIAGNOSIS — O0992 Supervision of high risk pregnancy, unspecified, second trimester: Secondary | ICD-10-CM | POA: Diagnosis not present

## 2018-11-05 DIAGNOSIS — Z6841 Body Mass Index (BMI) 40.0 and over, adult: Secondary | ICD-10-CM

## 2018-11-05 LAB — POCT URINALYSIS DIPSTICK OB
Bilirubin, UA: NEGATIVE
Blood, UA: NEGATIVE
Glucose, UA: NEGATIVE
Ketones, UA: NEGATIVE
Leukocytes, UA: NEGATIVE
Nitrite, UA: NEGATIVE
POC,PROTEIN,UA: NEGATIVE
Spec Grav, UA: 1.01 (ref 1.010–1.025)
Urobilinogen, UA: 0.2 E.U./dL
pH, UA: 6.5 (ref 5.0–8.0)

## 2018-11-05 MED ORDER — PERMETHRIN 5 % EX CREA: TOPICAL_CREAM | Freq: Once | CUTANEOUS | Status: DC

## 2018-11-05 NOTE — Telephone Encounter (Signed)
Asked Jenice to please have anatomy disc up front for pt.   FH to confirm with Jenice and contact pt.

## 2018-11-05 NOTE — Progress Notes (Signed)
Patient comes in today for  ROB visit.  

## 2018-11-05 NOTE — Telephone Encounter (Signed)
Pt called and stated that she previously spoke with FH in regards to getting the patient proof of u/s or a disc of u/s for pt's apt at [facility]. Pt is requesting a call back. Please advise.

## 2018-11-05 NOTE — Progress Notes (Signed)
ROB: Increasing BMI discussed.  Possible scabies-treat with permethrin cream.  1 hour GCT next visit.

## 2018-11-06 NOTE — Telephone Encounter (Signed)
Replied to pt's mychart message.

## 2018-11-26 ENCOUNTER — Other Ambulatory Visit: Payer: Self-pay

## 2018-11-26 ENCOUNTER — Encounter: Payer: Self-pay | Admitting: Obstetrics and Gynecology

## 2018-11-26 ENCOUNTER — Other Ambulatory Visit: Payer: Medicaid Other

## 2018-11-26 ENCOUNTER — Ambulatory Visit (INDEPENDENT_AMBULATORY_CARE_PROVIDER_SITE_OTHER): Payer: Medicaid Other | Admitting: Obstetrics and Gynecology

## 2018-11-26 VITALS — BP 130/85 | HR 99 | Wt 347.9 lb

## 2018-11-26 DIAGNOSIS — Z130 Encounter for screening for diseases of the blood and blood-forming organs and certain disorders involving the immune mechanism: Secondary | ICD-10-CM

## 2018-11-26 DIAGNOSIS — O0993 Supervision of high risk pregnancy, unspecified, third trimester: Secondary | ICD-10-CM

## 2018-11-26 DIAGNOSIS — O09299 Supervision of pregnancy with other poor reproductive or obstetric history, unspecified trimester: Secondary | ICD-10-CM

## 2018-11-26 DIAGNOSIS — Z23 Encounter for immunization: Secondary | ICD-10-CM

## 2018-11-26 DIAGNOSIS — Z131 Encounter for screening for diabetes mellitus: Secondary | ICD-10-CM

## 2018-11-26 DIAGNOSIS — Z6841 Body Mass Index (BMI) 40.0 and over, adult: Secondary | ICD-10-CM

## 2018-11-26 LAB — POCT URINALYSIS DIPSTICK OB
Bilirubin, UA: NEGATIVE
Blood, UA: NEGATIVE
Glucose, UA: NEGATIVE
Ketones, UA: NEGATIVE
Nitrite, UA: NEGATIVE
Spec Grav, UA: 1.025 (ref 1.010–1.025)
Urobilinogen, UA: 0.2 E.U./dL
pH, UA: 7 (ref 5.0–8.0)

## 2018-11-26 MED ORDER — TETANUS-DIPHTH-ACELL PERTUSSIS 5-2.5-18.5 LF-MCG/0.5 IM SUSP
0.5000 mL | Freq: Once | INTRAMUSCULAR | Status: AC
  Administered 2018-11-26: 10:00:00 0.5 mL via INTRAMUSCULAR

## 2018-11-26 NOTE — Patient Instructions (Addendum)
https://www.cdc.gov/vaccines/hcp/vis/vis-statements/tdap.pdf">  Tdap Vaccine (Tetanus, Diphtheria and Pertussis): What You Need to Know 1. Why get vaccinated? Tetanus, diphtheria and pertussis are very serious diseases. Tdap vaccine can protect [place] from these diseases. And, Tdap vaccine given to pregnant women can protect newborn babies against pertussis.Marland Kitchen TETANUS (Lockjaw) is rare in the [place] today. It causes painful muscle tightening and stiffness, usually all over the body.  It can lead to tightening of muscles in the head and neck so you can't open your mouth, swallow, or sometimes even breathe. Tetanus kills about 1 out of 10 people who are infected even after receiving the best medical care. DIPHTHERIA is also rare in the [place] today. It can cause a thick coating to form in the back of the throat.  It can lead to breathing problems, heart failure, paralysis, and death. PERTUSSIS (Whooping Cough) causes severe coughing spells, which can cause difficulty breathing, vomiting and disturbed sleep.  It can also lead to weight loss, incontinence, and rib fractures. Up to 2 in 100 adolescents and 5 in 100 adults with pertussis are hospitalized or have complications, which could include pneumonia or death. These diseases are caused by bacteria. Diphtheria and pertussis are spread from person to person through secretions from coughing or sneezing. Tetanus enters the body through cuts, scratches, or wounds. Before vaccines, as many as 200,000 cases of diphtheria, 200,000 cases of pertussis, and hundreds of cases of tetanus, were reported in the [place] each year. Since vaccination began, reports of cases for tetanus and diphtheria have dropped by about 99% and for pertussis by about 80%. 2. Tdap vaccine Tdap vaccine can protect adolescents and adults from tetanus, diphtheria, and pertussis. One dose of Tdap is routinely given at age 34 or 33. People who did not get Tdap at that age  should get it as soon as possible. Tdap is especially important for healthcare professionals and anyone having close contact with a baby younger than 12 months. Pregnant women should get a dose of Tdap during every pregnancy, to protect the newborn from pertussis. Infants are most at risk for severe, life-threatening complications from pertussis. Another vaccine, called Td, protects against tetanus and diphtheria, but not pertussis. A Td booster should be given every 10 years. Tdap may be given as one of these boosters if you have never gotten Tdap before. Tdap may also be given after a severe cut or burn to prevent tetanus infection. Your doctor or the person giving you the vaccine can give you more information. Tdap may safely be given at the same time as other vaccines. 3. Some people should not get this vaccine  A person who has ever had a life-threatening allergic reaction after a previous dose of any diphtheria, tetanus or pertussis containing vaccine, OR has a severe allergy to any part of this vaccine, should not get Tdap vaccine. Tell the person giving the vaccine about any severe allergies.  Anyone who had coma or long repeated seizures within 7 days after a childhood dose of DTP or DTaP, or a previous dose of Tdap, should not get Tdap, unless a cause other than the vaccine was found. They can still get Td.  Talk to your doctor if you: ? have seizures or another nervous system problem, ? had severe pain or swelling after any vaccine containing diphtheria, tetanus or pertussis, ? ever had a condition called Guillain-Barr Syndrome (GBS), ? aren't feeling well on the day the shot is scheduled. 4. Risks With any medicine, including vaccines,  there is a chance of side effects. These are usually mild and go away on their own. Serious reactions are also possible but are rare. Most people who get Tdap vaccine do not have any problems with it. Mild problems following Tdap (Did not interfere  with activities)  Pain where the shot was given (about 3 in 4 adolescents or 2 in 3 adults)  Redness or swelling where the shot was given (about 1 person in 5)  Mild fever of at least 100.4F (up to about 1 in 25 adolescents or 1 in 100 adults)  Headache (about 3 or 4 people in 10)  Tiredness (about 1 person in 3 or 4)  Nausea, vomiting, diarrhea, stomach ache (up to 1 in 4 adolescents or 1 in 10 adults)  Chills, sore joints (about 1 person in 10)  Body aches (about 1 person in 3 or 4)  Rash, swollen glands (uncommon) Moderate problems following Tdap (Interfered with activities, but did not require medical attention)  Pain where the shot was given (up to 1 in 5 or 6)  Redness or swelling where the shot was given (up to about 1 in 16 adolescents or 1 in 12 adults)  Fever over 102F (about 1 in 100 adolescents or 1 in 250 adults)  Headache (about 1 in 7 adolescents or 1 in 10 adults)  Nausea, vomiting, diarrhea, stomach ache (up to 1 or 3 people in 100)  Swelling of the entire arm where the shot was given (up to about 1 in 500). Severe problems following Tdap (Unable to perform usual activities; required medical attention)  Swelling, severe pain, bleeding and redness in the arm where the shot was given (rare). Problems that could happen after any vaccine:  People sometimes faint after a medical procedure, including vaccination. Sitting or lying down for about 15 minutes can help prevent fainting, and injuries caused by a fall. Tell your doctor if you feel dizzy, or have vision changes or ringing in the ears.  Some people get severe pain in the shoulder and have difficulty moving the arm where a shot was given. This happens very rarely.  Any medication can cause a severe allergic reaction. Such reactions from a vaccine are very rare, estimated at fewer than 1 in a million doses, and would happen within a few minutes to a few hours after the vaccination. As with any medicine,  there is a very remote chance of a vaccine causing a serious injury or death. The safety of vaccines is always being monitored. For more information, visit: http://www.aguilar.org/ 5. What if there is a serious problem? What should I look for?  Look for anything that concerns you, such as signs of a severe allergic reaction, very high fever, or unusual behavior. Signs of a severe allergic reaction can include hives, swelling of the face and throat, difficulty breathing, a fast heartbeat, dizziness, and weakness. These would usually start a few minutes to a few hours after the vaccination. What should I do?  If you think it is a severe allergic reaction or other emergency that can't wait, call 9-1-1 or get the person to the nearest hospital. Otherwise, call your doctor.  Afterward, the reaction should be reported to the Vaccine Adverse Event Reporting System (VAERS). Your doctor might file this report, or you can do it yourself through the VAERS web site at www.vaers.SamedayNews.es, or by calling (445)263-6211. VAERS does not give medical advice. 6. The National Vaccine Injury Compensation Program The [employer] Vaccine Injury Compensation Program (  VICP) is a federal program that was created to compensate people who may have been injured by certain vaccines. Persons who believe they may have been injured by a vaccine can learn about the program and about filing a claim by calling 737-584-3381 or visiting the [place] website at GoldCloset.com.ee. There is a time limit to file a claim for compensation. 7. How can I learn more?  Ask your doctor. He or she can give you the vaccine package insert or suggest other sources of information.  Call your local or state health department.  Contact the Centers for Disease Control and Prevention (CDC): ? Call 210-044-4263 (1-800-CDC-INFO) or ? Visit CDC's website at http://hunter.com/ Vaccine Information Statement Tdap Vaccine (03/10/2013) This  information is not intended to replace advice given to you by your health care provider. Make sure you discuss any questions you have with your health care provider. Document Released: 07/03/2011 Document Revised: 08/19/2017 Document Reviewed: 08/19/2017 Elsevier Interactive Patient Education  2020 [place] of Pregnancy  The third trimester is from week 28 through week 40 (months 7 through 9). This trimester is when your unborn baby (fetus) is growing very fast. At the end of the ninth month, the unborn baby is about 20 inches in length. It weighs about 6-10 pounds. Follow these instructions at home: Medicines  Take over-the-counter and prescription medicines only as told by your doctor. Some medicines are safe and some medicines are not safe during pregnancy.  Take a prenatal vitamin that contains at least 600 micrograms (mcg) of folic acid.  If you have trouble pooping (constipation), take medicine that will make your stool soft (stool softener) if your doctor approves. Eating and drinking   Eat regular, healthy meals.  Avoid raw meat and uncooked cheese.  If you get low calcium from the food you eat, talk to your doctor about taking a daily calcium supplement.  Eat four or five small meals rather than three large meals a day.  Avoid foods that are high in fat and sugars, such as fried and sweet foods.  To prevent constipation: ? Eat foods that are high in fiber, like fresh fruits and vegetables, whole grains, and beans. ? Drink enough fluids to keep your pee (urine) clear or pale yellow. Activity  Exercise only as told by your doctor. Stop exercising if you start to have cramps.  Avoid heavy lifting, wear low heels, and sit up straight.  Do not exercise if it is too hot, too humid, or if you are in a place of great height (high altitude).  You may continue to have sex unless your doctor tells you not to. Relieving pain and discomfort  Wear a good  support bra if your breasts are tender.  Take frequent breaks and rest with your legs raised if you have leg cramps or low back pain.  Take warm water baths (sitz baths) to soothe pain or discomfort caused by hemorrhoids. Use hemorrhoid cream if your doctor approves.  If you develop puffy, bulging veins (varicose veins) in your legs: ? Wear support hose or compression stockings as told by your doctor. ? Raise (elevate) your feet for 15 minutes, 3-4 times a day. ? Limit salt in your food. Safety  Wear your seat belt when driving.  Make a list of emergency phone numbers, including numbers for family, friends, the hospital, and police and fire departments. Preparing for your baby's arrival To prepare for the arrival of your baby:  Take prenatal classes.  Practice driving  to the hospital.  Visit the hospital and tour the maternity area.  Talk to your work about taking leave once the baby comes.  Pack your hospital bag.  Prepare the baby's room.  Go to your doctor visits.  Buy a rear-facing car seat. Learn how to install it in your car. General instructions  Do not use hot tubs, steam rooms, or saunas.  Do not use any products that contain nicotine or tobacco, such as cigarettes and e-cigarettes. If you need help quitting, ask your doctor.  Do not drink alcohol.  Do not douche or use tampons or scented sanitary pads.  Do not cross your legs for long periods of time.  Do not travel for long distances unless you must. Only do so if your doctor says it is okay.  Visit your dentist if you have not gone during your pregnancy. Use a soft toothbrush to brush your teeth. Be gentle when you floss.  Avoid cat litter boxes and soil used by cats. These carry germs that can cause birth defects in the baby and can cause a loss of your baby (miscarriage) or stillbirth.  Keep all your prenatal visits as told by your doctor. This is important. Contact a doctor if:  You are not sure if  you are in labor or if your water has broken.  You are dizzy.  You have mild cramps or pressure in your lower belly.  You have a nagging pain in your belly area.  You continue to feel sick to your stomach, you throw up, or you have watery poop.  You have bad smelling fluid coming from your vagina.  You have pain when you pee. Get help right away if:  You have a fever.  You are leaking fluid from your vagina.  You are spotting or bleeding from your vagina.  You have severe belly cramps or pain.  You lose or gain weight quickly.  You have trouble catching your breath and have chest pain.  You notice sudden or extreme puffiness (swelling) of your face, hands, ankles, feet, or legs.  You have not felt the baby move in over an hour.  You have severe headaches that do not go away with medicine.  You have trouble seeing.  You are leaking, or you are having a gush of fluid, from your vagina before you are 37 weeks.  You have regular belly spasms (contractions) before you are 37 weeks. Summary  The third trimester is from week 28 through week 40 (months 7 through 9). This time is when your unborn baby is growing very fast.  Follow your doctor's advice about medicine, food, and activity.  Get ready for the arrival of your baby by taking prenatal classes, getting all the baby items ready, preparing the baby's room, and visiting your doctor to be checked.  Get help right away if you are bleeding from your vagina, or you have chest pain and trouble catching your breath, or if you have not felt your baby move in over an hour. This information is not intended to replace advice given to you by your health care provider. Make sure you discuss any questions you have with your health care provider. Document Released: 03/28/2009 Document Revised: 04/24/2018 Document Reviewed: 02/07/2016 Elsevier Patient Education  2020 [employer]    Breastfeeding  Choosing to breastfeed is one  of the best decisions you can make for yourself and your baby. A change in hormones during pregnancy causes your breasts to make breast  milk in your milk-producing glands. Hormones prevent breast milk from being released before your baby is born. They also prompt milk flow after birth. Once breastfeeding has begun, thoughts of your baby, as well as his or her sucking or crying, can stimulate the release of milk from your milk-producing glands. Benefits of breastfeeding Research shows that breastfeeding offers many health benefits for infants and mothers. It also offers a cost-free and convenient way to feed your baby. For your baby  Your first milk (colostrum) helps your baby's digestive system to function better.  Special cells in your milk (antibodies) help your baby to fight off infections.  Breastfed babies are less likely to develop asthma, allergies, obesity, or type 2 diabetes. They are also at lower risk for sudden infant death syndrome (SIDS).  Nutrients in breast milk are better able to meet your babys needs compared to infant formula.  Breast milk improves your baby's brain development. For you  Breastfeeding helps to create a very special bond between you and your baby.  Breastfeeding is convenient. Breast milk costs nothing and is always available at the correct temperature.  Breastfeeding helps to burn calories. It helps you to lose the weight that you gained during pregnancy.  Breastfeeding makes your uterus return faster to its size before pregnancy. It also slows bleeding (lochia) after you give birth.  Breastfeeding helps to lower your risk of developing type 2 diabetes, osteoporosis, rheumatoid arthritis, cardiovascular disease, and breast, ovarian, uterine, and endometrial cancer later in life. Breastfeeding basics Starting breastfeeding  Find a comfortable place to sit or lie down, with your neck and back well-supported.  Place a pillow or a rolled-up blanket under  your baby to bring him or her to the level of your breast (if you are seated). Nursing pillows are specially designed to help support your arms and your baby while you breastfeed.  Make sure that your baby's tummy (abdomen) is facing your abdomen.  Gently massage your breast. With your fingertips, massage from the outer edges of your breast inward toward the nipple. This encourages milk flow. If your milk flows slowly, you may need to continue this action during the feeding.  Support your breast with 4 fingers underneath and your thumb above your nipple (make the letter "C" with your hand). Make sure your fingers are well away from your nipple and your babys mouth.  Stroke your baby's lips gently with your finger or nipple.  When your baby's mouth is open wide enough, quickly bring your baby to your breast, placing your entire nipple and as much of the areola as possible into your baby's mouth. The areola is the colored area around your nipple. ? More areola should be visible above your baby's upper lip than below the lower lip. ? Your baby's lips should be opened and extended outward (flanged) to ensure an adequate, comfortable latch. ? Your baby's tongue should be between his or her lower gum and your breast.  Make sure that your baby's mouth is correctly positioned around your nipple (latched). Your baby's lips should create a seal on your breast and be turned out (everted).  It is common for your baby to suck about 2-3 minutes in order to start the flow of breast milk. Latching Teaching your baby how to latch onto your breast properly is very important. An improper latch can cause nipple pain, decreased milk supply, and poor weight gain in your baby. Also, if your baby is not latched onto your nipple properly, he  or she may swallow some air during feeding. This can make your baby fussy. Burping your baby when you switch breasts during the feeding can help to get rid of the air. However,  teaching your baby to latch on properly is still the best way to prevent fussiness from swallowing air while breastfeeding. Signs that your baby has successfully latched onto your nipple  Silent tugging or silent sucking, without causing you pain. Infant's lips should be extended outward (flanged).  Swallowing heard between every 3-4 sucks once your milk has started to flow (after your let-down milk reflex occurs).  Muscle movement above and in front of his or her ears while sucking. Signs that your baby has not successfully latched onto your nipple  Sucking sounds or smacking sounds from your baby while breastfeeding.  Nipple pain. If you think your baby has not latched on correctly, slip your finger into the corner of your babys mouth to break the suction and place it between your baby's gums. Attempt to start breastfeeding again. Signs of successful breastfeeding Signs from your baby  Your baby will gradually decrease the number of sucks or will completely stop sucking.  Your baby will fall asleep.  Your baby's body will relax.  Your baby will retain a small amount of milk in his or her mouth.  Your baby will let go of your breast by himself or herself. Signs from you  Breasts that have increased in firmness, weight, and size 1-3 hours after feeding.  Breasts that are softer immediately after breastfeeding.  Increased milk volume, as well as a change in milk consistency and color by the fifth day of breastfeeding.  Nipples that are not sore, cracked, or bleeding. Signs that your baby is getting enough milk  Wetting at least 1-2 diapers during the first 24 hours after birth.  Wetting at least 5-6 diapers every 24 hours for the first week after birth. The urine should be clear or pale yellow by the age of 5 days.  Wetting 6-8 diapers every 24 hours as your baby continues to grow and develop.  At least 3 stools in a 24-hour period by the age of 5 days. The stool should be  soft and yellow.  At least 3 stools in a 24-hour period by the age of 7 days. The stool should be seedy and yellow.  No loss of weight greater than 10% of birth weight during the first 3 days of life.  Average weight gain of 4-7 oz (113-198 g) per week after the age of 4 days.  Consistent daily weight gain by the age of 5 days, without weight loss after the age of 2 weeks. After a feeding, your baby may spit up a small amount of milk. This is normal. Breastfeeding frequency and duration Frequent feeding will help you make more milk and can prevent sore nipples and extremely full breasts (breast engorgement). Breastfeed when you feel the need to reduce the fullness of your breasts or when your baby shows signs of hunger. This is called "breastfeeding on demand." Signs that your baby is hungry include:  Increased alertness, activity, or restlessness.  Movement of the head from side to side.  Opening of the mouth when the corner of the mouth or cheek is stroked (rooting).  Increased sucking sounds, smacking lips, cooing, sighing, or squeaking.  Hand-to-mouth movements and sucking on fingers or hands.  Fussing or crying. Avoid introducing a pacifier to your baby in the first 4-6 weeks after your baby  is born. After this time, you may choose to use a pacifier. Research has shown that pacifier use during the first year of a baby's life decreases the risk of sudden infant death syndrome (SIDS). Allow your baby to feed on each breast as long as he or she wants. When your baby unlatches or falls asleep while feeding from the first breast, offer the second breast. Because newborns are often sleepy in the first few weeks of life, you may need to awaken your baby to get him or her to feed. Breastfeeding times will vary from baby to baby. However, the following rules can serve as a guide to help you make sure that your baby is properly fed:  Newborns (babies 57 weeks of age or younger) may breastfeed  every 1-3 hours.  Newborns should not go without breastfeeding for longer than 3 hours during the day or 5 hours during the night.  You should breastfeed your baby a minimum of 8 times in a 24-hour period. Breast milk pumping     Pumping and storing breast milk allows you to make sure that your baby is exclusively fed your breast milk, even at times when you are unable to breastfeed. This is especially important if you go back to work while you are still breastfeeding, or if you are not able to be present during feedings. Your lactation consultant can help you find a method of pumping that works best for you and give you guidelines about how long it is safe to store breast milk. Caring for your breasts while you breastfeed Nipples can become dry, cracked, and sore while breastfeeding. The following recommendations can help keep your breasts moisturized and healthy:  Avoid using soap on your nipples.  Wear a supportive bra designed especially for nursing. Avoid wearing underwire-style bras or extremely tight bras (sports bras).  Air-dry your nipples for 3-4 minutes after each feeding.  Use only cotton bra pads to absorb leaked breast milk. Leaking of breast milk between feedings is normal.  Use lanolin on your nipples after breastfeeding. Lanolin helps to maintain your skin's normal moisture barrier. Pure lanolin is not harmful (not toxic) to your baby. You may also hand express a few drops of breast milk and gently massage that milk into your nipples and allow the milk to air-dry. In the first few weeks after giving birth, some women experience breast engorgement. Engorgement can make your breasts feel heavy, warm, and tender to the touch. Engorgement peaks within 3-5 days after you give birth. The following recommendations can help to ease engorgement:  Completely empty your breasts while breastfeeding or pumping. You may want to start by applying warm, moist heat (in the shower or with  warm, water-soaked hand towels) just before feeding or pumping. This increases circulation and helps the milk flow. If your baby does not completely empty your breasts while breastfeeding, pump any extra milk after he or she is finished.  Apply ice packs to your breasts immediately after breastfeeding or pumping, unless this is too uncomfortable for you. To do this: ? Put ice in a plastic bag. ? Place a towel between your skin and the bag. ? Leave the ice on for 20 minutes, 2-3 times a day.  Make sure that your baby is latched on and positioned properly while breastfeeding. If engorgement persists after 48 hours of following these recommendations, contact your health care provider or a Advertising copywriter. Overall health care recommendations while breastfeeding  Eat 3 healthy meals and 3  snacks every day. Well-nourished mothers who are breastfeeding need an additional 450-500 calories a day. You can meet this requirement by increasing the amount of a balanced diet that you eat.  Drink enough water to keep your urine pale yellow or clear.  Rest often, relax, and continue to take your prenatal vitamins to prevent fatigue, stress, and low vitamin and mineral levels in your body (nutrient deficiencies).  Do not use any products that contain nicotine or tobacco, such as cigarettes and e-cigarettes. Your baby may be harmed by chemicals from cigarettes that pass into breast milk and exposure to secondhand smoke. If you need help quitting, ask your health care provider.  Avoid alcohol.  Do not use illegal drugs or marijuana.  Talk with your health care provider before taking any medicines. These include over-the-counter and prescription medicines as well as vitamins and herbal supplements. Some medicines that may be harmful to your baby can pass through breast milk.  It is possible to become pregnant while breastfeeding. If birth control is desired, ask your health care provider about options that  will be safe while breastfeeding your baby. Where to find more information: [employer]: www.llli.org Contact a health care provider if:  You feel like you want to stop breastfeeding or have become frustrated with breastfeeding.  Your nipples are cracked or bleeding.  Your breasts are red, tender, or warm.  You have: ? Painful breasts or nipples. ? A swollen area on either breast. ? A fever or chills. ? Nausea or vomiting. ? Drainage other than breast milk from your nipples.  Your breasts do not become full before feedings by the fifth day after you give birth.  You feel sad and depressed.  Your baby is: ? Too sleepy to eat well. ? Having trouble sleeping. ? More than 63 week old and wetting fewer than 6 diapers in a 24-hour period. ? Not gaining weight by 99 days of age.  Your baby has fewer than 3 stools in a 24-hour period.  Your baby's skin or the white parts of his or her eyes become yellow. Get help right away if:  Your baby is overly tired (lethargic) and does not want to wake up and feed.  Your baby develops an unexplained fever. Summary  Breastfeeding offers many health benefits for infant and mothers.  Try to breastfeed your infant when he or she shows early signs of hunger.  Gently tickle or stroke your baby's lips with your finger or nipple to allow the baby to open his or her mouth. Bring the baby to your breast. Make sure that much of the areola is in your baby's mouth. Offer one side and burp the baby before you offer the other side.  Talk with your health care provider or lactation consultant if you have questions or you face problems as you breastfeed. This information is not intended to replace advice given to you by your health care provider. Make sure you discuss any questions you have with your health care provider. Document Released: 01/01/2005 Document Revised: 03/28/2017 Document Reviewed: 02/03/2016 Elsevier Patient Education   2020 [employer].    Contraception Choices Contraception, also called birth control, means things to use or ways to try not to get pregnant. Hormonal birth control This kind of birth control uses hormones. Here are some types of hormonal birth control:  A tube that is put under skin of the arm (implant). The tube can stay in for as long as 3 years.  Shots to get every 3 months (injections).  Pills to take every day (birth control pills).  A patch to change 1 time each week for 3 weeks (birth control patch). After that, the patch is taken off for 1 week.  A ring to put in the vagina. The ring is left in for 3 weeks. Then it is taken out of the vagina for 1 week. Then a new ring is put in.  Pills to take after unprotected sex (emergency birth control pills). Barrier birth control Here are some types of barrier birth control:  A thin covering that is put on the penis before sex (female condom). The covering is thrown away after sex.  A soft, loose covering that is put in the vagina before sex (female condom). The covering is thrown away after sex.  A rubber bowl that sits over the cervix (diaphragm). The bowl must be made for you. The bowl is put into the vagina before sex. The bowl is left in for 6-8 hours after sex. It is taken out within 24 hours.  A small, soft cup that fits over the cervix (cervical cap). The cup must be made for you. The cup can be left in for 6-8 hours after sex. It is taken out within 48 hours.  A sponge that is put into the vagina before sex. It must be left in for at least 6 hours after sex. It must be taken out within 30 hours. Then it is thrown away.  A chemical that kills or stops sperm from getting into the uterus (spermicide). It may be a pill, cream, jelly, or foam to put in the vagina. The chemical should be used at least 10-15 minutes before sex. IUD (intrauterine) birth control An IUD is a small, T-shaped piece of plastic. It is put inside the  uterus. There are two kinds:  Hormone IUD. This kind can stay in for 3-5 years.  Copper IUD. This kind can stay in for 10 years. Permanent birth control Here are some types of permanent birth control:  Surgery to block the fallopian tubes.  Having an insert put into each fallopian tube.  Surgery to tie off the tubes that carry sperm (vasectomy). Natural planning birth control Here are some types of natural planning birth control:  Not having sex on the days the woman could get pregnant.  Using a calendar: ? To keep track of the length of each period. ? To find out what days pregnancy can happen. ? To plan to not have sex on days when pregnancy can happen.  Watching for symptoms of ovulation and not having sex during ovulation. One way the woman can check for ovulation is to check her temperature.  Waiting to have sex until after ovulation. Summary  Contraception, also called birth control, means things to use or ways to try not to get pregnant.  Hormonal methods of birth control include implants, injections, pills, patches, vaginal rings, and emergency birth control pills.  Barrier methods of birth control can include female condoms, female condoms, diaphragms, cervical caps, sponges, and spermicides.  There are two types of IUD (intrauterine device) birth control. An IUD can be put in a woman's uterus to prevent pregnancy for 3-5 years.  Permanent sterilization can be done through a procedure for males, females, or both.  Natural planning methods involve not having sex on the days when the woman could get pregnant. This information is not intended to replace advice given to you by your health care  provider. Make sure you discuss any questions you have with your health care provider. Document Released: 10/29/2008 Document Revised: 04/23/2018 Document Reviewed: 01/12/2016 Elsevier Patient Education  2020 [employer].

## 2018-11-26 NOTE — Progress Notes (Signed)
ROB-Pt present for routine prenatal care, 1 hr GTT and tdap vaccine. Went over [employer] ge[employer] ready information with pt.  Pt stated having pelvic pain.

## 2018-11-26 NOTE — Progress Notes (Signed)
ROB: Is noting some pelvic pressure but otherwise doing well. For 28 week labs today.  Desires to breast and bottle feed. Desires unsure method for contraception, handout given. For Tdap today, signed blood consent, discussed cord blood banking. Discussed ready set Baby Set Baby (see below).  Discussed BMI (currently 52, was 49 at conception). Patient notes she doesn't understand as she is currently exercising and trying to eat healthy. Discussed seeing a nutritionist. Will refer. For growth scans beginning at 32 weeks, and NSTs at 36 weeks. Discussed need for IOL due to BMI by 40 weeks.   [employer] Education discussed.  The following were addressed during this visit:  Breastfeeding Education - Early initiation of breastfeeding    Comments: Keeps milk supply adequate, helps contract uterus and slow bleeding, and early milk is the perfect first food and is easy to digest.   - The importance of exclusive breastfeeding    Comments: Provides antibodies, Lower risk of breast and ovarian cancers, and type-2 diabetes,Helps your body recover, Reduced chance of SIDS.   - Risks of giving your baby anything other than breast milk if you are breastfeeding    Comments: Make the baby less content with breastfeeds, may make my baby more susceptible to illness, and may reduce my milk supply.   - The importance of early skin-to-skin contact    Comments: Keeps baby warm and secure, helps keep baby's blood sugar up and breathing steady, easier to bond and breastfeed, and helps calm baby.  - Rooming-in on a 24-hour basis    Comments: Easier to learn baby's feeding cues, easier to bond and get to know each other, and encourages milk production.   - Feeding on demand or baby-led feeding    Comments: Helps prevent breastfeeding complications, helps bring in good milk supply, prevents under or overfeeding, and helps baby feel content and satisfied   - Frequent feeding to help assure optimal milk production     Comments: Making a full supply of milk requires frequent removal of milk from breasts, infant will eat 8-12 times in 24 hours, if separated from infant use breast massage, hand expression and/ or pumping to remove milk from breasts.

## 2018-11-27 LAB — GLUCOSE, 1 HOUR GESTATIONAL: Gestational Diabetes Screen: 111 mg/dL (ref 65–139)

## 2018-11-27 LAB — CBC
Hematocrit: 32 % — ABNORMAL LOW (ref 34.0–46.6)
Hemoglobin: 10.4 g/dL — ABNORMAL LOW (ref 11.1–15.9)
MCH: 25.9 pg — ABNORMAL LOW (ref 26.6–33.0)
MCHC: 32.5 g/dL (ref 31.5–35.7)
MCV: 80 fL (ref 79–97)
Platelets: 252 10*3/uL (ref 150–450)
RBC: 4.01 x10E6/uL (ref 3.77–5.28)
RDW: 13.8 % (ref 11.7–15.4)
WBC: 8.3 10*3/uL (ref 3.4–10.8)

## 2018-11-27 LAB — RPR: RPR Ser Ql: NONREACTIVE

## 2018-12-04 ENCOUNTER — Observation Stay
Admission: EM | Admit: 2018-12-04 | Discharge: 2018-12-04 | Disposition: A | Discharge status: Home / Self Care | Payer: Medicaid Other | Attending: Obstetrics and Gynecology | Admitting: Obstetrics and Gynecology

## 2018-12-04 ENCOUNTER — Other Ambulatory Visit: Payer: Self-pay

## 2018-12-04 DIAGNOSIS — Z3A29 29 weeks gestation of pregnancy: Secondary | ICD-10-CM | POA: Diagnosis not present

## 2018-12-04 DIAGNOSIS — O4703 False labor before 37 completed weeks of gestation, third trimester: Secondary | ICD-10-CM | POA: Diagnosis not present

## 2018-12-04 DIAGNOSIS — O26893 Other specified pregnancy related conditions, third trimester: Secondary | ICD-10-CM | POA: Diagnosis not present

## 2018-12-04 DIAGNOSIS — N898 Other specified noninflammatory disorders of vagina: Secondary | ICD-10-CM | POA: Diagnosis not present

## 2018-12-04 DIAGNOSIS — O09899 Supervision of other high risk pregnancies, unspecified trimester: Secondary | ICD-10-CM

## 2018-12-04 LAB — URINALYSIS, ROUTINE W REFLEX MICROSCOPIC
Bilirubin Urine: NEGATIVE
Glucose, UA: NEGATIVE mg/dL
Hgb urine dipstick: NEGATIVE
Ketones, ur: NEGATIVE mg/dL
Nitrite: NEGATIVE
Protein, ur: 30 mg/dL — AB
Specific Gravity, Urine: 1.018 (ref 1.005–1.030)
pH: 8 (ref 5.0–8.0)

## 2018-12-04 LAB — WET PREP, GENITAL
Clue Cells Wet Prep HPF POC: NONE SEEN
Sperm: NONE SEEN
Trich, Wet Prep: NONE SEEN
Yeast Wet Prep HPF POC: NONE SEEN

## 2018-12-04 MED ORDER — ACETAMINOPHEN 500 MG PO TABS
ORAL_TABLET | ORAL | Status: AC
  Administered 2018-12-04: 1000 mg via ORAL
  Filled 2018-12-04: qty 2

## 2018-12-04 MED ORDER — ACETAMINOPHEN 500 MG PO TABS
1000.0000 mg | ORAL_TABLET | Freq: Once | ORAL | Status: AC
  Administered 2018-12-04: 13:00:00 1000 mg via ORAL

## 2018-12-04 NOTE — OB Triage Note (Signed)
Pt presents to floor c/o ctx around 0800 today. Pt states her ctx seem to be getting closer together. Pt states that she feels like she needs to push. No vaginal bleeding or fluid. Pt states that she does feel infant move. VS WDL. Pt feels pain in her pelvic area. States she feels like baby is trying to come down.

## 2018-12-04 NOTE — Progress Notes (Addendum)
NST reactive per gestational age. A.Cherry, MD aware of fetal strip. Pt d/c home per verbal order from A.Marcelline Mates, MD. Pt received AVS and discharge instructions. Pt given labor precautions and when to call OBGYN office or come back to the ED. Pt verbalized understanding. Pt has a follow up appointment on 11/25 but understands she can call and reschedule if she needs something earlier. Pt d/c home with significant other via personal vehicle.

## 2018-12-04 NOTE — Final Progress Note (Signed)
L&D OB Triage Note  Chasta Deshpande is a 23 y.o. [id number] female at [redacted]w[redacted]d, EDD Estimated Date of Delivery: 02/16/19 who presented to triage for complaints of contractions and pelvic pressure. She also reported green-yellow vaginal discharge x 1 day.   She was evaluated by the nurses with no significant findingsfor preterm labor. Vital signs stable. An NST was performed and has been reviewed by MD. She was treated with PO fluids and Tylenol 1000 mg.   NST INTERPRETATION: Indications: rule out uterine contractions  Mode: External Baseline Rate (A): 145 bpm(fht) Variability: Moderate Accelerations: 15 x 15 Decelerations: None     Contraction Frequency (min): No ctx noted  Impression: reactive   Labs:  Results for orders placed or performed during the hospital encounter of 12/04/18  Wet prep, genital   Specimen: Vaginal  Result Value Ref Range   Yeast Wet Prep HPF POC NONE SEEN NONE SEEN   Trich, Wet Prep NONE SEEN NONE SEEN   Clue Cells Wet Prep HPF POC NONE SEEN NONE SEEN   WBC, Wet Prep HPF POC MODERATE (A) NONE SEEN   Sperm NONE SEEN   Urinalysis, Routine w reflex microscopic  Result Value Ref Range   Color, Urine YELLOW (A) YELLOW   APPearance CLOUDY (A) CLEAR   Specific Gravity, Urine 1.018 1.005 - 1.030   pH 8.0 5.0 - 8.0   Glucose, UA NEGATIVE NEGATIVE mg/dL   Hgb urine dipstick NEGATIVE NEGATIVE   Bilirubin Urine NEGATIVE NEGATIVE   Ketones, ur NEGATIVE NEGATIVE mg/dL   Protein, ur 30 (A) NEGATIVE mg/dL   Nitrite NEGATIVE NEGATIVE   Leukocytes,Ua LARGE (A) NEGATIVE   RBC / HPF 0-5 0 - 5 RBC/hpf   WBC, UA 21-50 0 - 5 WBC/hpf   Bacteria, UA RARE (A) NONE SEEN   Squamous Epithelial / LPF 6-10 0 - 5   Mucus PRESENT       Plan: NST performed was reviewed and was found to be reactive. She was discharged home with bleeding/preterm labor precautions.  Encouraged to continue to adequately hydrate. Continue routine prenatal care. Follow up with OB/GYN as previously scheduled.      Rubie Maid, MD  [facility]

## 2018-12-10 ENCOUNTER — Other Ambulatory Visit: Payer: Self-pay

## 2018-12-10 ENCOUNTER — Ambulatory Visit (INDEPENDENT_AMBULATORY_CARE_PROVIDER_SITE_OTHER): Payer: Medicaid Other | Admitting: Obstetrics and Gynecology

## 2018-12-10 ENCOUNTER — Encounter: Payer: Self-pay | Admitting: Obstetrics and Gynecology

## 2018-12-10 VITALS — BP 122/76 | HR 91 | Wt 345.8 lb

## 2018-12-10 DIAGNOSIS — O0993 Supervision of high risk pregnancy, unspecified, third trimester: Secondary | ICD-10-CM

## 2018-12-10 LAB — POCT URINALYSIS DIPSTICK OB
Bilirubin, UA: NEGATIVE
Blood, UA: NEGATIVE
Glucose, UA: NEGATIVE
Ketones, UA: NEGATIVE
Leukocytes, UA: NEGATIVE
Nitrite, UA: NEGATIVE
Spec Grav, UA: 1.01 (ref 1.010–1.025)
Urobilinogen, UA: 0.2 E.U./dL
pH, UA: 6 (ref 5.0–8.0)

## 2018-12-10 NOTE — Progress Notes (Signed)
ROB: No complaints.  Normal 1 hour GCT.  Patient has not yet had her second anesthesia consult.  We will try to get that scheduled for 34 weeks.  Reports daily fetal movement.

## 2018-12-10 NOTE — Progress Notes (Signed)
Patient comes in today for [place] visit. She is having pelvic pressure.

## 2018-12-15 ENCOUNTER — Ambulatory Visit: Payer: Medicaid Other | Admitting: Dietician

## 2018-12-17 ENCOUNTER — Telehealth: Payer: Self-pay | Admitting: Dietician

## 2018-12-17 NOTE — Telephone Encounter (Signed)
Called patient to check on rescheduling her cancelled appointment from 12/15/18. She is unable to reschedule yet, but will call back when she is able to reschedule.

## 2018-12-19 ENCOUNTER — Other Ambulatory Visit: Payer: Medicaid Other

## 2018-12-31 ENCOUNTER — Ambulatory Visit (INDEPENDENT_AMBULATORY_CARE_PROVIDER_SITE_OTHER): Payer: Medicaid Other | Admitting: Obstetrics and Gynecology

## 2018-12-31 ENCOUNTER — Encounter: Payer: Self-pay | Admitting: Obstetrics and Gynecology

## 2018-12-31 ENCOUNTER — Other Ambulatory Visit: Payer: Self-pay

## 2018-12-31 VITALS — BP 132/80 | HR 111 | Wt 348.8 lb

## 2018-12-31 DIAGNOSIS — O4703 False labor before 37 completed weeks of gestation, third trimester: Secondary | ICD-10-CM

## 2018-12-31 DIAGNOSIS — Z3A33 33 weeks gestation of pregnancy: Secondary | ICD-10-CM

## 2018-12-31 DIAGNOSIS — O479 False labor, unspecified: Secondary | ICD-10-CM

## 2018-12-31 DIAGNOSIS — O09293 Supervision of pregnancy with other poor reproductive or obstetric history, third trimester: Secondary | ICD-10-CM

## 2018-12-31 DIAGNOSIS — O99213 Obesity complicating pregnancy, third trimester: Secondary | ICD-10-CM

## 2018-12-31 DIAGNOSIS — S80862A Insect bite (nonvenomous), left lower leg, initial encounter: Secondary | ICD-10-CM

## 2018-12-31 DIAGNOSIS — W57XXXA Bitten or stung by nonvenomous insect and other nonvenomous arthropods, initial encounter: Secondary | ICD-10-CM

## 2018-12-31 DIAGNOSIS — O09299 Supervision of pregnancy with other poor reproductive or obstetric history, unspecified trimester: Secondary | ICD-10-CM

## 2018-12-31 DIAGNOSIS — O0993 Supervision of high risk pregnancy, unspecified, third trimester: Secondary | ICD-10-CM

## 2018-12-31 LAB — POCT URINALYSIS DIPSTICK OB
Bilirubin, UA: NEGATIVE
Blood, UA: NEGATIVE
Glucose, UA: NEGATIVE
Ketones, UA: NEGATIVE
Nitrite, UA: NEGATIVE
Spec Grav, UA: >=1.03 — AB (ref 1.010–1.025)
Urobilinogen, UA: 0.2 E.U./dL
pH, UA: 6 (ref 5.0–8.0)

## 2018-12-31 MED ORDER — HYDROXYZINE PAMOATE 50 MG PO CAPS: 50.0000 mg | ORAL_CAPSULE | Freq: Three times a day (TID) | ORAL | 1 refills | Status: DC | PRN

## 2018-12-31 NOTE — Progress Notes (Signed)
ROB: Patient notes worsening [place] [address], to the point of not sleeping for the past several weeks. Also noting back pain. Has been doing stretches but not helping. Patient very tearful, noting she is in pain. Tearful during today's visit. Will prescribe Vistaril for contractions and sleep. Also with multiple bug bites on lower extremities. Using benadryl cream and hydrocortisone cream. Scheduled for Anesthesia consultation in 2 weeks (01/17/27). Scheduled for growth scan tomorrow. RTC in 2 weeks. To be scheduled for NSTs at 36 weeks.

## 2018-12-31 NOTE — Progress Notes (Signed)
ROB-Pt present for routine prenatal care. Pt stated having swollen legs and feet. Pt stated having a lot of all over body pain.

## 2019-01-01 ENCOUNTER — Ambulatory Visit (INDEPENDENT_AMBULATORY_CARE_PROVIDER_SITE_OTHER): Payer: Medicaid Other

## 2019-01-01 DIAGNOSIS — Z362 Encounter for other antenatal screening follow-up: Secondary | ICD-10-CM

## 2019-01-01 DIAGNOSIS — O0993 Supervision of high risk pregnancy, unspecified, third trimester: Secondary | ICD-10-CM

## 2019-01-01 DIAGNOSIS — Z6841 Body Mass Index (BMI) 40.0 and over, adult: Secondary | ICD-10-CM

## 2019-01-01 DIAGNOSIS — O09299 Supervision of pregnancy with other poor reproductive or obstetric history, unspecified trimester: Secondary | ICD-10-CM

## 2019-01-05 ENCOUNTER — Observation Stay
Admission: EM | Admit: 2019-01-05 | Discharge: 2019-01-05 | Disposition: A | Discharge status: Home / Self Care | Payer: Medicaid Other | Attending: Obstetrics and Gynecology | Admitting: Obstetrics and Gynecology

## 2019-01-05 ENCOUNTER — Other Ambulatory Visit: Payer: Self-pay

## 2019-01-05 ENCOUNTER — Encounter: Payer: Self-pay | Admitting: Obstetrics and Gynecology

## 2019-01-05 DIAGNOSIS — O26893 Other specified pregnancy related conditions, third trimester: Principal | ICD-10-CM | POA: Insufficient documentation

## 2019-01-05 DIAGNOSIS — N898 Other specified noninflammatory disorders of vagina: Secondary | ICD-10-CM

## 2019-01-05 DIAGNOSIS — Z3A34 34 weeks gestation of pregnancy: Secondary | ICD-10-CM | POA: Insufficient documentation

## 2019-01-05 LAB — URINALYSIS, ROUTINE W REFLEX MICROSCOPIC
Bilirubin Urine: NEGATIVE
Glucose, UA: NEGATIVE mg/dL
Hgb urine dipstick: NEGATIVE
Ketones, ur: NEGATIVE mg/dL
Nitrite: NEGATIVE
Protein, ur: 30 mg/dL — AB
Specific Gravity, Urine: 1.02 (ref 1.005–1.030)
WBC, UA: >50 WBC/hpf — ABNORMAL HIGH (ref 0–5)
pH: 6 (ref 5.0–8.0)

## 2019-01-05 LAB — RUPTURE OF MEMBRANE (ROM)PLUS: Rom Plus: NEGATIVE

## 2019-01-05 MED ORDER — NITROFURANTOIN MONOHYD MACRO 100 MG PO CAPS
100.0000 mg | ORAL_CAPSULE | Freq: Two times a day (BID) | ORAL | Status: DC
  Administered 2019-01-05: 100 mg via ORAL
  Filled 2019-01-05: qty 1

## 2019-01-05 NOTE — Progress Notes (Signed)
   01/05/19 1423  Provider Notification  Provider Name/Title Dr. Amalia Hailey  Method of Notification Phone  Request Evaluate - remote  Notification Reason Other (Comment) (arrival, review case)  I reviewed the patient's obstetric and medical history, current presentation and complaints as well as reviewing the Fetal heart rate strip with Dr. Amalia Hailey. I also received new orders. I will continue to evaluate and contact the physician when results become available.

## 2019-01-05 NOTE — OB Triage Note (Signed)
Patient presents with c/o large amount of clear fluids, no bleeding, no odor around 0845. She is also complaining of sharp pain about every 4 minutes in the RLQ 8 out of 10 pain. She states she is only feeling irregular  Braxton hicks contractions but nothing painful. Patient is not wearing a pad now, and I did not see any obvious fluid upon assessment. Will assess and contact the provider.

## 2019-01-05 NOTE — Discharge Instructions (Signed)
Please pick up your prescription from Dr. Amalia Hailey for Robin Cameron at your pharmacy after discharge and complete entire course. Call your [facility] provider for any leaking of fluids, vaginal bleeding, decreased baby movements, or consistent contractions.

## 2019-01-05 NOTE — Progress Notes (Signed)
Patient discharged home with her husband. AVS given and all questions answered. Necessity of picking up and finishing macrobid prescription was stressed. I reviewed the patient's choice of pharmacy with her and her husband. I reviewed labor precautions and when to call the provider.

## 2019-01-05 NOTE — Progress Notes (Signed)
   01/05/19 1109  Provider Notification  Provider Name/Title Dr. Amalia Hailey, MD  Method of Notification Phone  Request Evaluate - remote  Notification Reason Lab/diagnostic study results  reviewed negative ROM plus and urinalysis results with Dr. Amalia Hailey. I also reviewed the fetal heart rate strip as well as uterine activity with Dr. Amalia Hailey. He gave orders for one time macrobid, and after discharge. He will put in an order for macrobid to pick up once she goes home at her pharmacy.

## 2019-01-06 ENCOUNTER — Other Ambulatory Visit: Payer: Self-pay | Admitting: Obstetrics and Gynecology

## 2019-01-06 DIAGNOSIS — N3001 Acute cystitis with hematuria: Secondary | ICD-10-CM

## 2019-01-06 MED ORDER — NITROFURANTOIN MONOHYD MACRO 100 MG PO CAPS: 100.0000 mg | ORAL_CAPSULE | Freq: Two times a day (BID) | ORAL | 1 refills | Status: DC

## 2019-01-06 NOTE — Discharge Summary (Signed)
    L&D OB Triage Note  SUBJECTIVE Robin Cameron is a 23 y.o. E3P2951 female at [redacted]w[redacted]d, EDD Estimated Date of Delivery: 02/16/19 who presented to triage with complaints of large gush of fluid-patient concerned her water broke.  Denies bleeding or significant cramping.  Minimal leakage since that time.  OB History  Gravida Para Term Preterm AB Living  3 2 2  0 0 2  SAB TAB Ectopic Multiple Live Births  0 0 0 0 2    # Outcome Date GA Lbr Len/2nd Weight Sex Delivery Anes PTL Lv  3 Current           2 Term 2015 [redacted]w[redacted]d  4309 g F Vag-Spont  N LIV  1 Term 08/09/12 [redacted]w[redacted]d 00:10 4082 g F Vag-Spont Local  LIV     Birth Comments: didn't know she was pregnant    No medications prior to admission.     OBJECTIVE  Nursing Evaluation:   BP (!) 146/89   Pulse 93   Temp 98.3 F (36.8 C) (Oral)   Resp 20   Ht 5\' 8"  (1.727 m)   Wt (!) 157.9 kg   LMP 04/27/2018   BMI 52.91 kg/m    Findings:   UA consistent with likely UTI.  ROM plus negative.  NST was performed and has been reviewed by me.  NST INTERPRETATION: Category I  Mode: External Baseline Rate (A): 130 bpm Variability: Moderate Accelerations: 15 x 15 Decelerations: None     Contraction Frequency (min): 7-12  ASSESSMENT Impression:  1.  Pregnancy:  G3P2002 at [redacted]w[redacted]d , EDD Estimated Date of Delivery: 02/16/19 2.  NST:  Category I  3.  Likely UTI  PLAN 1. Reassurance given -no rupture of membranes 2. Discharge home with standard labor precautions given to return to L&D or call the office for problems. 3. Continue routine prenatal care.  We will treat with Macrobid for suspected UTI.

## 2019-01-13 ENCOUNTER — Encounter: Payer: Self-pay | Admitting: Obstetrics and Gynecology

## 2019-01-13 ENCOUNTER — Other Ambulatory Visit: Payer: Self-pay

## 2019-01-13 ENCOUNTER — Encounter
Admission: RE | Admit: 2019-01-13 | Discharge: 2019-01-13 | Disposition: A | Discharge status: Home / Self Care | Payer: Medicaid Other | Source: Ambulatory Visit | Attending: Anesthesiology | Admitting: Anesthesiology

## 2019-01-13 ENCOUNTER — Ambulatory Visit (INDEPENDENT_AMBULATORY_CARE_PROVIDER_SITE_OTHER): Payer: Medicaid Other | Admitting: Obstetrics and Gynecology

## 2019-01-13 VITALS — BP 120/74 | HR 99 | Wt 351.5 lb

## 2019-01-13 DIAGNOSIS — O0993 Supervision of high risk pregnancy, unspecified, third trimester: Secondary | ICD-10-CM

## 2019-01-13 LAB — POCT URINALYSIS DIPSTICK OB
Bilirubin, UA: NEGATIVE
Blood, UA: NEGATIVE
Glucose, UA: NEGATIVE
Ketones, UA: NEGATIVE
Leukocytes, UA: NEGATIVE
Nitrite, UA: NEGATIVE
Spec Grav, UA: 1.01 (ref 1.010–1.025)
Urobilinogen, UA: 0.2 E.U./dL
pH, UA: 7 (ref 5.0–8.0)

## 2019-01-13 NOTE — Consult Note (Signed)
[facility] Anesthesia Consultation  Robin Cameron ZOX:[id number] DOB: 1995/09/14 DOA: 01/13/2019 PCP: Robin Market, MD   Requesting physician: Dr. Amalia Hailey  Date of consultation: 01/13/19 Reason for consultation: Obesity during pregnancy  CHIEF COMPLAINT:  Obesity during pregnancy  HISTORY OF PRESENT ILLNESS: Robin Cameron  is a 23 y.o. female with a known history of obesity during pregnancy. This is her third baby, had an epidural with her second baby. Denies hx of cardiovascular disease. Denies hx of asthma. Denies personal or family hx of bleeding disorders.   PAST MEDICAL HISTORY:   Past Medical History:  Diagnosis Date  . Allergic rhinitis   . Obesity     PAST SURGICAL HISTORY:  Past Surgical History:  Procedure Laterality Date  . NO PAST SURGERIES    . WISDOM TOOTH EXTRACTION      SOCIAL HISTORY:  Social History   Tobacco Use  . Smoking status: Never Smoker  . Smokeless tobacco: Never Used  Substance Use Topics  . Alcohol use: Not Currently    Comment: ocass    FAMILY HISTORY:  Family History  Problem Relation Age of Onset  . COPD Maternal Grandmother   . Hypertension Maternal Grandmother   . Hyperlipidemia Maternal Grandfather   . Hypertension Maternal Grandfather   . COPD Maternal Grandfather   . Diabetes Mother   . Diabetes Father     DRUG ALLERGIES:  Allergies  Allergen Reactions  . Shrimp [Shellfish Allergy] Rash  . Amoxicillin Rash  . Mushroom Extract Complex Rash    REVIEW OF SYSTEMS:   RESPIRATORY: No cough, shortness of breath, wheezing.  CARDIOVASCULAR: No chest pain, orthopnea, edema.  HEMATOLOGY: No anemia, easy bruising or bleeding SKIN: No rash or lesion. NEUROLOGIC: No tingling, numbness, weakness.  PSYCHIATRY: No anxiety or depression.   MEDICATIONS AT HOME:  Prior to Admission medications   Medication Sig Start Date End Date Taking? Authorizing Provider  folic acid (FOLVITE) 1 MG tablet  Take 1 tablet (1 mg total) by mouth daily. 06/24/18   Lawhorn, Lara Mulch, CNM  hydrOXYzine (VISTARIL) 50 MG capsule Take 1 capsule (50 mg total) by mouth 3 (three) times daily as needed for nausea or vomiting [facility]). 12/31/18   Rubie Maid, MD  nitrofurantoin, macrocrystal-monohydrate, (MACROBID) 100 MG capsule Take 1 capsule (100 mg total) by mouth 2 (two) times daily. 01/06/19   Harlin Heys, MD  Prenatal Vit-Fe Fumarate-FA (PRENATAL MULTIVITAMIN) TABS tablet Take 1 tablet by mouth daily at 12 noon.    [provider]      PHYSICAL EXAMINATION:   VITAL SIGNS: Last menstrual period 04/27/2018.  GENERAL:  23 y.o.-year-old patient no acute distress.  HEENT: Head atraumatic, normocephalic. Oropharynx and nasopharynx clear. MP 2, TM distance >3 cm, normal mouth opening, grade 1 upper lip bite. LUNGS: Normal breath sounds bilaterally, no wheezing, rales,rhonchi. No use of accessory muscles of respiration.  CARDIOVASCULAR: S1, S2 normal. No murmurs, rubs, or gallops.  EXTREMITIES: No pedal edema, cyanosis, or clubbing.  NEUROLOGIC: normal gait PSYCHIATRIC: The patient is alert and oriented x 3.  SKIN: No obvious rash, lesion, or ulcer.    IMPRESSION AND PLAN:   Robin Cameron  is a 23 y.o. female presenting with obesity during pregnancy. BMI is currently 53 at [redacted] weeks gestation.   Airway exam remains reassuring. Spinal interspaces minimally palpable.   We discussed analgesic options during labor including epidural analgesia. Discussed that in obesity there can be increased difficulty with epidural placement or even failure of  successful epidural. We also discussed that even after successful epidural placement there is increased risk of catheter migration out of the epidural space that would require catheter replacement. Discussed use of epidural vs spinal vs GA if cesarean delivery is required. Discussed increased risk of difficult intubation during pregnancy  should an emergency cesarean delivery be required.   Plan for delivery at [facility].

## 2019-01-13 NOTE — Progress Notes (Signed)
ROB: Active daily fetal movement.  Ultrasound results reviewed.  37 percentile growth.  Patient has no complaints today.  Has anesthesia follow-up referral today.  Cultures next visit.  NST next visit.

## 2019-01-16 NOTE — L&D Delivery Note (Addendum)
Delivery Summary for [employer]  Labor Events:   Preterm labor: No data found  Rupture date: 02/04/2019  Rupture time: 7:26 AM  Rupture type: Artificial Intact Possible ROM - for evaluation  Fluid Color: Clear  Induction: No data found  Augmentation: No data found  Complications: No data found  Cervical ripening: No data found No data found   No data found     Delivery:   Episiotomy: No data found  Lacerations: No data found  Repair suture: No data found  Repair # of packets: No data found  Blood loss (ml): 500   Information for the patient's newborn:  Denesha, Brouse [[id number]]    Delivery 02/04/2019 11:04 AM by  Vaginal, Spontaneous Sex:  female Gestational Age: [redacted]w[redacted]d Delivery Clinician:   Living?:         APGARS  One minute Five minutes Ten minutes  Skin color:        Heart rate:        Grimace:        Muscle tone:        Breathing:        Totals: 8  9      Presentation/position:      Resuscitation:   Cord information:    Disposition of cord blood:     Blood gases sent?  Complications:   Placenta: Delivered:       appearance Newborn Measurements: Weight: 9 lb 3.8 oz (4190 g)  Height: 21"  Head circumference:    Chest circumference:    Other providers:    Additional  information: Forceps:   Vacuum:   Breech:   Observed anomalies      Delivery Note At 11:04 AM a viable and healthy female was delivered via Vaginal, Spontaneous (Presentation:  Vertex; ROA position).  APGAR: 8, 9; weight 9 lb 3.8 oz (4190 g).   Placenta status: Spontaneous, Intact.  Cord: 3 vessels with the following complications: Body cord x 1.  Cord pH: not obtained.  Delayed cord clamping observed for > 2 minutes.   Anesthesia: Epidural Episiotomy: None Lacerations:  None Suture Repair: None Est. Blood Loss (mL):  500  Mom to postpartum.  Baby to Couplet care / Skin to Skin.  Hildred Laser 02/04/2019, 11:25 AM

## 2019-01-21 ENCOUNTER — Ambulatory Visit (INDEPENDENT_AMBULATORY_CARE_PROVIDER_SITE_OTHER): Payer: Medicaid Other | Admitting: Obstetrics and Gynecology

## 2019-01-21 ENCOUNTER — Encounter: Payer: Self-pay | Admitting: Obstetrics and Gynecology

## 2019-01-21 ENCOUNTER — Other Ambulatory Visit: Payer: Self-pay

## 2019-01-21 VITALS — BP 149/88 | HR 109 | Wt 350.1 lb

## 2019-01-21 DIAGNOSIS — O09299 Supervision of pregnancy with other poor reproductive or obstetric history, unspecified trimester: Secondary | ICD-10-CM

## 2019-01-21 DIAGNOSIS — Z6841 Body Mass Index (BMI) 40.0 and over, adult: Secondary | ICD-10-CM

## 2019-01-21 DIAGNOSIS — O0993 Supervision of high risk pregnancy, unspecified, third trimester: Secondary | ICD-10-CM

## 2019-01-21 DIAGNOSIS — R030 Elevated blood-pressure reading, without diagnosis of hypertension: Secondary | ICD-10-CM

## 2019-01-21 LAB — POCT URINALYSIS DIPSTICK OB
Bilirubin, UA: NEGATIVE
Blood, UA: NEGATIVE
Glucose, UA: NEGATIVE
Ketones, UA: NEGATIVE
Nitrite, UA: NEGATIVE
Spec Grav, UA: 1.025 (ref 1.010–1.025)
Urobilinogen, UA: 0.2 E.U./dL
pH, UA: 6 (ref 5.0–8.0)

## 2019-01-21 LAB — OB RESULTS CONSOLE GC/CHLAMYDIA: Gonorrhea: NEGATIVE

## 2019-01-21 LAB — OB RESULTS CONSOLE GBS: GBS: NEGATIVE

## 2019-01-21 NOTE — Progress Notes (Signed)
ROB: Patient complains of continued off and on contractions and pelvic pressure/back pain. Previously discussed alleviating measures with patient. 36 week labs done today. Initial BP elevated with repeat normal. 36 week labs done. Cervical exam with funneling, external os 4 cm, but internal os 2 cm.  Patient's appointment to late to complete NST,  will schedule for tomorrow with another BPP.  RTC 1 week, and can begin NST's. Discussed labor precautions.

## 2019-01-21 NOTE — Progress Notes (Signed)
ROB-Pt present for routine prenatal care and 36 week cultures. Pt stated having contractions off and on and a lot of pain and pressure in the lower pelvic and back area.

## 2019-01-22 ENCOUNTER — Other Ambulatory Visit: Payer: Self-pay

## 2019-01-22 ENCOUNTER — Other Ambulatory Visit (INDEPENDENT_AMBULATORY_CARE_PROVIDER_SITE_OTHER): Payer: Medicaid Other

## 2019-01-22 DIAGNOSIS — O0993 Supervision of high risk pregnancy, unspecified, third trimester: Secondary | ICD-10-CM

## 2019-01-22 DIAGNOSIS — Z3689 Encounter for other specified antenatal screening: Secondary | ICD-10-CM

## 2019-01-23 LAB — STREP GP B NAA+RFLX: Strep Gp B NAA+Rflx: NEGATIVE

## 2019-01-23 LAB — GC/CHLAMYDIA PROBE AMP
Chlamydia trachomatis, NAA: NEGATIVE
Neisseria Gonorrhoeae by PCR: NEGATIVE

## 2019-01-26 ENCOUNTER — Other Ambulatory Visit: Payer: Self-pay

## 2019-01-26 ENCOUNTER — Encounter: Payer: Self-pay | Admitting: Obstetrics and Gynecology

## 2019-01-26 ENCOUNTER — Observation Stay
Admission: EM | Admit: 2019-01-26 | Discharge: 2019-01-26 | Disposition: A | Discharge status: Home / Self Care | Payer: Medicaid Other | Attending: Obstetrics and Gynecology | Admitting: Obstetrics and Gynecology

## 2019-01-26 DIAGNOSIS — O471 False labor at or after 37 completed weeks of gestation: Principal | ICD-10-CM | POA: Insufficient documentation

## 2019-01-26 DIAGNOSIS — Z3A37 37 weeks gestation of pregnancy: Secondary | ICD-10-CM | POA: Diagnosis not present

## 2019-01-26 DIAGNOSIS — O09899 Supervision of other high risk pregnancies, unspecified trimester: Secondary | ICD-10-CM

## 2019-01-26 LAB — RUPTURE OF MEMBRANE (ROM)PLUS: Rom Plus: NEGATIVE

## 2019-01-26 MED ORDER — OXYCODONE HCL 5 MG PO TABS: 10.0000 mg | ORAL_TABLET | Freq: Once | ORAL | Status: DC | PRN

## 2019-01-26 NOTE — OB Triage Note (Signed)
Pt presents to the ED c/o ctx and possible leaking of fluid. Pt is 37w G3P2002. Pt states she was shopping/walking  and ctx picked up and became unbearable. Pt states she went to the bathroom and had a soaked pad with clear fluid and small amount of white discharge. Pt denies vaginal bleeding and states positive fetal movement. Pt states she had a recent vaginal infection which she took antibiotics for and has completed her course. Pt states she is having back pain that wraps around to the abdomen. External monitors applied and assessing. Initial FHR 150. VSS. A.Cherry, MD made aware. Orders placed.

## 2019-01-26 NOTE — Progress Notes (Signed)
Pt d/c home per A.Valentino Saxon, MD order. Pt refused oxycodone for pain, pt states, "It makes me hallucinate," pt will take tylenol  when she goes home instead. Pt received AVS and discharge instructions given. Pt received labor precautions/ signs and symptoms, pt verbalized understanding. RN answered all questions. Pt has a follow up appointment at [facility] 01/28/2019. Pt d/c home with husband at bedside via personal vehicle.

## 2019-01-27 ENCOUNTER — Encounter: Payer: Self-pay | Admitting: Surgical

## 2019-01-28 ENCOUNTER — Other Ambulatory Visit: Payer: Self-pay

## 2019-01-28 ENCOUNTER — Other Ambulatory Visit (INDEPENDENT_AMBULATORY_CARE_PROVIDER_SITE_OTHER): Payer: Medicaid Other

## 2019-01-28 ENCOUNTER — Encounter: Payer: Self-pay | Admitting: Obstetrics and Gynecology

## 2019-01-28 ENCOUNTER — Encounter: Payer: Medicaid Other | Admitting: Obstetrics and Gynecology

## 2019-01-28 ENCOUNTER — Ambulatory Visit (INDEPENDENT_AMBULATORY_CARE_PROVIDER_SITE_OTHER): Payer: Medicaid Other | Admitting: Obstetrics and Gynecology

## 2019-01-28 ENCOUNTER — Other Ambulatory Visit: Payer: Self-pay | Admitting: Obstetrics and Gynecology

## 2019-01-28 ENCOUNTER — Other Ambulatory Visit: Payer: Medicaid Other

## 2019-01-28 VITALS — BP 144/104 | HR 108 | Wt 346.5 lb

## 2019-01-28 DIAGNOSIS — O368130 Decreased fetal movements, third trimester, not applicable or unspecified: Secondary | ICD-10-CM

## 2019-01-28 DIAGNOSIS — O0993 Supervision of high risk pregnancy, unspecified, third trimester: Secondary | ICD-10-CM | POA: Diagnosis not present

## 2019-01-28 DIAGNOSIS — Z3A37 37 weeks gestation of pregnancy: Secondary | ICD-10-CM | POA: Diagnosis not present

## 2019-01-28 DIAGNOSIS — O480 Post-term pregnancy: Secondary | ICD-10-CM

## 2019-01-28 LAB — POCT URINALYSIS DIPSTICK OB
Bilirubin, UA: NEGATIVE
Blood, UA: NEGATIVE
Glucose, UA: NEGATIVE
Leukocytes, UA: NEGATIVE
Nitrite, UA: NEGATIVE
POC,PROTEIN,UA: NEGATIVE
Spec Grav, UA: 1.01 (ref 1.010–1.025)
Urobilinogen, UA: 0.2 E.U./dL
pH, UA: 6.5 (ref 5.0–8.0)

## 2019-01-28 NOTE — Progress Notes (Signed)
Patient had BPP today-6 out of 8 but had breathing movement and after ultrasound baby was obviously actively moving. Initial blood pressure elevated but repeat much improved.  Will check UPC ratio and CBC today.  Follow-up Friday for blood pressure check and evaluation of today's labs.  Warnings given for preeclampsia.

## 2019-01-28 NOTE — Final Progress Note (Signed)
L&D OB Triage Note  Robin Cameron is a 24 y.o. [id number] female at [redacted]w[redacted]d, EDD Estimated Date of Delivery: 02/16/19 who presented to triage for complaints of contractions and possible LOF (states she went to the bathroom and had a pad soaked with clear fluid and white discharge).  She was evaluated by the nurses with no significant findings for ruptured membranes or active labor. Vital signs stable. An NST was performed and has been reviewed by MD. She was offered Percocet for pain but declined medication.    Physical Exam:  Vitals:   01/26/19 1353 01/26/19 1422 01/26/19 1504  BP: (!) 145/99 138/82   Pulse: (!) 110 (!) 108 100  Resp:  20   Temp:  98.1 F (36.7 C)   TempSrc:  Oral   Weight:  (!) 158.8 kg   Height:  5\' 8"  (1.727 m)    Pelvic exam deferred.    NST INTERPRETATION: Indications: rule out uterine contractions  Mode: External Baseline Rate (A): 130 bpm(fht) Variability: Moderate Accelerations: 15 x 15 Decelerations: None     Contraction Frequency (min): 7-13 with UI  Impression: reactive   Labs:  Results for orders placed or performed during the hospital encounter of 01/26/19  ROM Plus ([facility] only)  Result Value Ref Range   Rom Plus NEGATIVE     Plan: NST performed was reviewed and was found to be reactive. Initial BP elevated with normal repeat. She was discharged home with bleeding/labor precautions.  Notes she will take Tylenol and Benadryl at home. Continue routine prenatal care. Follow up with OB/GYN as previously scheduled.     03/26/19, MD

## 2019-01-29 LAB — CBC, NO DIFFERENTIAL/PLATELET
Hematocrit: 32.3 % — ABNORMAL LOW (ref 34.0–46.6)
Hemoglobin: 11 g/dL — ABNORMAL LOW (ref 11.1–15.9)
MCH: 25.6 pg — ABNORMAL LOW (ref 26.6–33.0)
MCHC: 34.1 g/dL (ref 31.5–35.7)
MCV: 75 fL — ABNORMAL LOW (ref 79–97)
RBC: 4.29 x10E6/uL (ref 3.77–5.28)
RDW: 15.2 % (ref 11.7–15.4)
WBC: 9.8 10*3/uL (ref 3.4–10.8)

## 2019-01-29 LAB — PROTEIN / CREATININE RATIO, URINE
Creatinine, Urine: 181.3 mg/dL
Protein, Ur: 19.9 mg/dL
Protein/Creat Ratio: 110 mg/g creat (ref 0–200)

## 2019-01-30 ENCOUNTER — Ambulatory Visit (INDEPENDENT_AMBULATORY_CARE_PROVIDER_SITE_OTHER): Payer: Medicaid Other | Admitting: Obstetrics and Gynecology

## 2019-01-30 ENCOUNTER — Encounter: Payer: Self-pay | Admitting: Obstetrics and Gynecology

## 2019-01-30 ENCOUNTER — Other Ambulatory Visit: Payer: Self-pay

## 2019-01-30 VITALS — BP 143/84 | HR 109 | Wt 350.0 lb

## 2019-01-30 DIAGNOSIS — O479 False labor, unspecified: Secondary | ICD-10-CM

## 2019-01-30 DIAGNOSIS — Z6841 Body Mass Index (BMI) 40.0 and over, adult: Secondary | ICD-10-CM

## 2019-01-30 DIAGNOSIS — O133 Gestational [pregnancy-induced] hypertension without significant proteinuria, third trimester: Secondary | ICD-10-CM

## 2019-01-30 DIAGNOSIS — O0993 Supervision of high risk pregnancy, unspecified, third trimester: Secondary | ICD-10-CM | POA: Diagnosis not present

## 2019-01-30 LAB — POCT URINALYSIS DIPSTICK OB
Bilirubin, UA: NEGATIVE
Blood, UA: NEGATIVE
Glucose, UA: NEGATIVE
Ketones, UA: NEGATIVE
Nitrite, UA: NEGATIVE
Spec Grav, UA: 1.01 (ref 1.010–1.025)
Urobilinogen, UA: 0.2 E.U./dL
pH, UA: 7.5 (ref 5.0–8.0)

## 2019-01-30 LAB — CBC WITH DIFFERENTIAL/PLATELET
Basophils Absolute: 0 10*3/uL (ref 0.0–0.2)
Basos: 0 %
EOS (ABSOLUTE): 0.1 10*3/uL (ref 0.0–0.4)
Eos: 1 %
Hematocrit: 35.7 % (ref 34.0–46.6)
Hemoglobin: 10.8 g/dL — ABNORMAL LOW (ref 11.1–15.9)
Immature Grans (Abs): 0 10*3/uL (ref 0.0–0.1)
Immature Granulocytes: 0 %
Lymphocytes Absolute: 2.2 10*3/uL (ref 0.7–3.1)
Lymphs: 22 %
MCH: 24.7 pg — ABNORMAL LOW (ref 26.6–33.0)
MCHC: 30.3 g/dL — ABNORMAL LOW (ref 31.5–35.7)
MCV: 82 fL (ref 79–97)
Monocytes Absolute: 0.5 10*3/uL (ref 0.1–0.9)
Monocytes: 5 %
Neutrophils Absolute: 7.3 10*3/uL — ABNORMAL HIGH (ref 1.4–7.0)
Neutrophils: 72 %
Platelets: 304 10*3/uL (ref 150–450)
RBC: 4.38 x10E6/uL (ref 3.77–5.28)
RDW: 15.3 % (ref 11.7–15.4)
WBC: 10.1 10*3/uL (ref 3.4–10.8)

## 2019-01-30 LAB — SPECIMEN STATUS REPORT

## 2019-01-30 NOTE — Progress Notes (Signed)
ROB: Patient complains of worsening "braxton hicks" contractions. Notes pain in back, radiates down to pelvis. Also feeling more pressure in the pelvis. BPs are still mildly elevated, +1 protein on today.  Recent PIH labs were normal. Reiterated pre-eclampsia precautions, more likely with third trimester GHTN. Discussed need for IOL around 38 weeks. Cervical exam overall unchanged, with funneling (external 4 cm, internal 2 cm, with palpable membranes). Reiterated labor precautions.

## 2019-01-30 NOTE — Patient Instructions (Signed)
COVID 19 Instructions for Scheduled Procedure (Inductions/C-sections and GYN surgeries)   Thank you for choosing [facility] for your services.  You have been scheduled for a procedure called ___________Induction of Labor____________________________.    Your procedure is scheduled on _______Wednesday, January 20th, 2021__at 6:00 a.m.___________________.  You are required to have COVID-19 testing performed 2 days prior to your scheduled procedure date.  Testing is performed between 9 and 10 AM Monday through Friday.  Please present for testing on _______Monday, January 18th, 2021___________ during this hour. Drive up testing is performed in front of [facility] (this is next to the [employer]).    Upon your scheduled procedure date, you will need to arrive at the [facility] entrance. (There is a statue at the front of this entrance.)   Please arrive on time if you are scheduled for an induction of labor.   If you are scheduled for a Cesarean delivery or for Gyn Surgery, arrive 2 hours prior to your procedure time.   If you are an Obstetric patient and your arrival time falls between 11 PM and 6 AM call L&D [phone]) when you arrive.  A staff member will meet you at the [facility] entrance.  At this time, patients are allowed 1 support person to accompany them. Face masks are required for you and your support person. Your support person is now allowed to be there with you during the entire time of your admission.   Please contact the office if you have any questions regarding this information.  The [facility] office number is [phone].     Thank you,    Your [facility] Providers       Labor Induction  Labor induction is when steps are taken to cause a pregnant woman to begin the labor process. Most women go into labor on their own between 37 weeks and 42 weeks of pregnancy. When this does not happen or when there is a medical need for labor  to begin, steps may be taken to induce labor. Labor induction causes a pregnant woman's uterus to contract. It also causes the cervix to soften (ripen), open (dilate), and thin out (efface). Usually, labor is not induced before 39 weeks of pregnancy unless there is a medical reason to do so. Your health care provider will determine if labor induction is needed. Before inducing labor, your health care provider will consider a number of factors, including:  Your medical condition and your baby's.  How many weeks along you are in your pregnancy.  How mature your baby's lungs are.  The condition of your cervix.  The position of your baby.  The size of your birth canal. What are some reasons for labor induction? Labor may be induced if:  Your health or your baby's health is at risk.  Your pregnancy is overdue by 1 week or more.  Your water breaks but labor does not start on its own.  There is a low amount of amniotic fluid around your baby. You may also choose (elect) to have labor induced at a certain time. Generally, elective labor induction is done no earlier than 39 weeks of pregnancy. What methods are used for labor induction? Methods used for labor induction include:  Prostaglandin medicine. This medicine starts contractions and causes the cervix to dilate and ripen. It can be taken by mouth (orally) or by being inserted into the vagina (suppository).  Inserting a small, thin tube (catheter) with a balloon  into the vagina and then expanding the balloon with water to dilate the cervix.  Stripping the membranes. In this method, your health care provider gently separates amniotic sac tissue from the cervix. This causes the cervix to stretch, which in turn causes the release of a hormone called progesterone. The hormone causes the uterus to contract. This procedure is often done during an office visit, after which you will be sent home to wait for contractions to begin.  Breaking the  water. In this method, your health care provider uses a small instrument to make a small hole in the amniotic sac. This eventually causes the amniotic sac to break. Contractions should begin after a few hours.  Medicine to trigger or strengthen contractions. This medicine is given through an IV that is inserted into a vein in your arm. Except for membrane stripping, which can be done in a clinic, labor induction is done in the hospital so that you and your baby can be carefully monitored. How long does it take for labor to be induced? The length of time it takes to induce labor depends on how ready your body is for labor. Some inductions can take up to 2-3 days, while others may take less than a day. Induction may take longer if:  You are induced early in your pregnancy.  It is your first pregnancy.  Your cervix is not ready. What are some risks associated with labor induction? Some risks associated with labor induction include:  Changes in fetal heart rate, such as being too high, too low, or irregular (erratic).  Failed induction.  Infection in the mother or the baby.  Increased risk of having a cesarean delivery.  Fetal death.  Breaking off (abruption) of the placenta from the uterus (rare).  Rupture of the uterus (very rare). When induction is needed for medical reasons, the benefits of induction generally outweigh the risks. What are some reasons for not inducing labor? Labor induction should not be done if:  Your baby does not tolerate contractions.  You have had previous surgeries on your uterus, such as a myomectomy, removal of fibroids, or a vertical scar from a previous cesarean delivery.  Your placenta lies very low in your uterus and blocks the opening of the cervix (placenta previa).  Your baby is not in a head-down position.  The umbilical cord drops down into the birth canal in front of the baby.  There are unusual circumstances, such as the baby being very  early (premature).  You have had more than 2 previous cesarean deliveries. Summary  Labor induction is when steps are taken to cause a pregnant woman to begin the labor process.  Labor induction causes a pregnant woman's uterus to contract. It also causes the cervix to ripen, dilate, and efface.  Labor is not induced before 39 weeks of pregnancy unless there is a medical reason to do so.  When induction is needed for medical reasons, the benefits of induction generally outweigh the risks. This information is not intended to replace advice given to you by your health care provider. Make sure you discuss any questions you have with your health care provider. Document Revised: 01/04/2017 Document Reviewed: 02/15/2016 Elsevier Patient Education  2020 [employer].

## 2019-01-30 NOTE — Progress Notes (Signed)
ROB-Pt present for routine prenatal care. Pt stated having swollen feet and legs, pressure and pain in the lower back and having braxton hick contractions that are worst at night and off and on throughout the day.

## 2019-02-01 ENCOUNTER — Other Ambulatory Visit: Payer: Self-pay | Admitting: Obstetrics and Gynecology

## 2019-02-02 ENCOUNTER — Other Ambulatory Visit
Admission: RE | Admit: 2019-02-02 | Discharge: 2019-02-02 | Disposition: A | Discharge status: Home / Self Care | Payer: Medicaid Other | Source: Ambulatory Visit | Attending: Obstetrics and Gynecology | Admitting: Obstetrics and Gynecology

## 2019-02-02 LAB — SARS CORONAVIRUS 2 (TAT 6-24 HRS): SARS Coronavirus 2: NEGATIVE

## 2019-02-03 ENCOUNTER — Ambulatory Visit (INDEPENDENT_AMBULATORY_CARE_PROVIDER_SITE_OTHER): Payer: Medicaid Other | Admitting: Obstetrics and Gynecology

## 2019-02-03 ENCOUNTER — Ambulatory Visit (INDEPENDENT_AMBULATORY_CARE_PROVIDER_SITE_OTHER): Payer: Medicaid Other

## 2019-02-03 ENCOUNTER — Encounter: Payer: Self-pay | Admitting: Obstetrics and Gynecology

## 2019-02-03 ENCOUNTER — Inpatient Hospital Stay
Admission: RE | Admit: 2019-02-03 | Discharge: 2019-02-06 | DRG: 807 | Disposition: A | Discharge status: Home / Self Care | Payer: Medicaid Other | Attending: Obstetrics and Gynecology | Admitting: Obstetrics and Gynecology

## 2019-02-03 ENCOUNTER — Other Ambulatory Visit: Payer: Self-pay

## 2019-02-03 VITALS — BP 131/84 | HR 116 | Wt 353.7 lb

## 2019-02-03 DIAGNOSIS — Z20822 Contact with and (suspected) exposure to covid-19: Secondary | ICD-10-CM | POA: Diagnosis present

## 2019-02-03 DIAGNOSIS — O133 Gestational [pregnancy-induced] hypertension without significant proteinuria, third trimester: Secondary | ICD-10-CM

## 2019-02-03 DIAGNOSIS — D649 Anemia, unspecified: Secondary | ICD-10-CM | POA: Diagnosis present

## 2019-02-03 DIAGNOSIS — Z3A38 38 weeks gestation of pregnancy: Secondary | ICD-10-CM

## 2019-02-03 DIAGNOSIS — O0993 Supervision of high risk pregnancy, unspecified, third trimester: Secondary | ICD-10-CM

## 2019-02-03 DIAGNOSIS — O09299 Supervision of pregnancy with other poor reproductive or obstetric history, unspecified trimester: Secondary | ICD-10-CM

## 2019-02-03 DIAGNOSIS — O99213 Obesity complicating pregnancy, third trimester: Secondary | ICD-10-CM

## 2019-02-03 DIAGNOSIS — O134 Gestational [pregnancy-induced] hypertension without significant proteinuria, complicating childbirth: Principal | ICD-10-CM | POA: Diagnosis present

## 2019-02-03 DIAGNOSIS — Z8759 Personal history of other complications of pregnancy, childbirth and the puerperium: Secondary | ICD-10-CM | POA: Diagnosis present

## 2019-02-03 DIAGNOSIS — O9902 Anemia complicating childbirth: Secondary | ICD-10-CM | POA: Diagnosis present

## 2019-02-03 DIAGNOSIS — O368130 Decreased fetal movements, third trimester, not applicable or unspecified: Secondary | ICD-10-CM

## 2019-02-03 DIAGNOSIS — O288 Other abnormal findings on antenatal screening of mother: Secondary | ICD-10-CM | POA: Diagnosis not present

## 2019-02-03 DIAGNOSIS — O09899 Supervision of other high risk pregnancies, unspecified trimester: Secondary | ICD-10-CM

## 2019-02-03 DIAGNOSIS — O99214 Obesity complicating childbirth: Secondary | ICD-10-CM | POA: Diagnosis present

## 2019-02-03 DIAGNOSIS — Z2839 Other underimmunization status: Secondary | ICD-10-CM

## 2019-02-03 HISTORY — DX: Personal history of other complications of pregnancy, childbirth and the puerperium: Z87.59

## 2019-02-03 LAB — POCT URINALYSIS DIPSTICK OB
Bilirubin, UA: NEGATIVE
Blood, UA: NEGATIVE
Glucose, UA: NEGATIVE
Ketones, UA: NEGATIVE
Leukocytes, UA: NEGATIVE
Nitrite, UA: NEGATIVE
POC,PROTEIN,UA: NEGATIVE
Spec Grav, UA: 1.02 (ref 1.010–1.025)
Urobilinogen, UA: 0.2 E.U./dL
pH, UA: 6 (ref 5.0–8.0)

## 2019-02-03 LAB — ABO/RH: ABO/RH(D): O POS

## 2019-02-03 LAB — TYPE AND SCREEN
ABO/RH(D): O POS
Antibody Screen: NEGATIVE

## 2019-02-03 LAB — CBC
HCT: 35.2 % — ABNORMAL LOW (ref 36.0–46.0)
Hemoglobin: 11.3 g/dL — ABNORMAL LOW (ref 12.0–15.0)
MCH: 24.7 pg — ABNORMAL LOW (ref 26.0–34.0)
MCHC: 32.1 g/dL (ref 30.0–36.0)
MCV: 77 fL — ABNORMAL LOW (ref 80.0–100.0)
Platelets: 251 10*3/uL (ref 150–400)
RBC: 4.57 MIL/uL (ref 3.87–5.11)
RDW: 15.2 % (ref 11.5–15.5)
WBC: 9.9 10*3/uL (ref 4.0–10.5)
nRBC: 0 % (ref 0.0–0.2)

## 2019-02-03 MED ORDER — ACETAMINOPHEN 325 MG PO TABS: 650.0000 mg | ORAL_TABLET | ORAL | Status: DC | PRN

## 2019-02-03 MED ORDER — OXYTOCIN BOLUS FROM INFUSION
500.0000 mL | Freq: Once | INTRAVENOUS | Status: AC
  Administered 2019-02-04: 11:00:00 500 mL via INTRAVENOUS

## 2019-02-03 MED ORDER — LIDOCAINE HCL (PF) 1 % IJ SOLN: 30.0000 mL | INTRAMUSCULAR | Status: DC | PRN

## 2019-02-03 MED ORDER — OXYCODONE-ACETAMINOPHEN 5-325 MG PO TABS: 1.0000 | ORAL_TABLET | ORAL | Status: DC | PRN

## 2019-02-03 MED ORDER — TERBUTALINE SULFATE 1 MG/ML IJ SOLN: 0.2500 mg | Freq: Once | INTRAMUSCULAR | Status: DC | PRN

## 2019-02-03 MED ORDER — LACTATED RINGERS IV SOLN
500.0000 mL | INTRAVENOUS | Status: DC | PRN
  Administered 2019-02-04: 09:00:00 500 mL via INTRAVENOUS

## 2019-02-03 MED ORDER — OXYCODONE-ACETAMINOPHEN 5-325 MG PO TABS: 2.0000 | ORAL_TABLET | ORAL | Status: DC | PRN

## 2019-02-03 MED ORDER — OXYTOCIN 40 UNITS IN NORMAL SALINE INFUSION - SIMPLE MED
1.0000 m[IU]/min | INTRAVENOUS | Status: DC
  Administered 2019-02-03: 23:00:00 2 m[IU]/min via INTRAVENOUS
  Filled 2019-02-03: qty 1000

## 2019-02-03 MED ORDER — SOD CITRATE-CITRIC ACID 500-334 MG/5ML PO SOLN: 30.0000 mL | ORAL | Status: DC | PRN

## 2019-02-03 MED ORDER — BUTORPHANOL TARTRATE 1 MG/ML IJ SOLN: 1.0000 mg | INTRAMUSCULAR | Status: DC | PRN

## 2019-02-03 MED ORDER — LACTATED RINGERS IV SOLN: INTRAVENOUS | Status: DC

## 2019-02-03 MED ORDER — ONDANSETRON HCL 4 MG/2ML IJ SOLN: 4.0000 mg | Freq: Four times a day (QID) | INTRAMUSCULAR | Status: DC | PRN

## 2019-02-03 MED ORDER — MISOPROSTOL 50MCG HALF TABLET: 50.0000 ug | ORAL_TABLET | ORAL | Status: DC | PRN

## 2019-02-03 MED ORDER — OXYTOCIN 40 UNITS IN NORMAL SALINE INFUSION - SIMPLE MED
2.5000 [IU]/h | INTRAVENOUS | Status: DC
  Administered 2019-02-04: 16:00:00 2.5 [IU]/h via INTRAVENOUS
  Filled 2019-02-03: qty 1000

## 2019-02-03 NOTE — Progress Notes (Signed)
Patient contracting to frequently for Cytotec placement. Will change method of induction to foley bulb placement and Pitocin.    Hildred Laser, MD [facility]

## 2019-02-03 NOTE — Progress Notes (Signed)
Pt in room with partner at time of assessment. Pt had meal from home at this time. Made aware that when complete this RN would come to start induction process. Agreed at this time.

## 2019-02-03 NOTE — Progress Notes (Signed)
ROB: Patient presents for f/u OB visit.  Still noting back pain and pressure. Noting some irregular contractions.  BPP performed today 4/8 (no movement or tone), however during office visit, fetal movement is palpable. Will send to the hospital for NST, and likely IOL as she was already scheduled for IOL tomorrow for her GHTN. Denies headaches, blurred vision, RUQ pain. Still with funneling of the cervix on exam.

## 2019-02-03 NOTE — Progress Notes (Signed)
This note also relates to the following rows which could not be included: Dose (milli-units/min) Oxytocin - Cannot attach notes to extension rows Rate (mL/hr) Oxytocin - Cannot attach notes to extension rows Concentration Oxytocin - Cannot attach notes to extension rows  PT called out, foley bulb out at this time spontaneously. 30 minutes after placement. Oxytocin infusing at this time per order.

## 2019-02-03 NOTE — Progress Notes (Deleted)
Obstetric History and Physical  Robin Cameron is a 24 y.o. morbidly obese G3P2002 with IUP at [redacted]w[redacted]d presenting from the office secondary to non-reassuring fetal testing (BP 4/8).  Also with gestational HTN, was scheduled for induction of labor tomorrow. Patient states she has been having  irregular contractions, none vaginal bleeding, intact membranes, with active fetal movement.    Prenatal Course Source of Care: [facility] with onset of care at 8 weeks Pregnancy complications or risks: Patient Active Problem List   Diagnosis Date Noted  . Gestational hypertension without significant proteinuria in third trimester 02/03/2019  . Indication for care in labor and delivery, antepartum 12/04/2018  . Morbid obesity with body mass index (BMI) of 50.0 to 59.9 in adult [facility]) 10/08/2018  . Pregnancy, supervision, high-risk, second trimester 10/08/2018  . History of macrosomia in infant in prior pregnancy, currently pregnant 10/08/2018  . Maternal varicella, non-immune 07/09/2018  . GERD (gastroesophageal reflux disease) 12/13/2012  . Elevated blood pressure reading 10/19/2012   She plans to breastfeed She desires unsure method for postpartum contraception.   Prenatal labs and studies: ABO, Rh: O/Positive/-- (06/23 1131) Antibody: Negative (06/23 1131) Rubella: 1.23 (06/23 1131) RPR: Non Reactive (11/11 1038)  HBsAg: Negative (06/23 1131)  HIV: Non Reactive (06/23 1131)  GBS:--/Negative (01/06 1638) 1 hr Glucola  Normal.  Genetic screening normal Anatomy US normal    Past Medical History:  Diagnosis Date  . Allergic rhinitis   . Obesity     Past Surgical History:  Procedure Laterality Date  . NO PAST SURGERIES    . WISDOM TOOTH EXTRACTION      OB History  Gravida Para Term Preterm AB Living  3 2 2     2   SAB TAB Ectopic Multiple Live Births          2    # Outcome Date GA Lbr Len/2nd Weight Sex Delivery Anes PTL Lv  3 Current           2 Term 2015 [redacted]w[redacted]d  4309 g  F Vag-Spont  N LIV  1 Term 08/09/12 [redacted]w[redacted]d 00:10 4082 g F Vag-Spont Local  LIV     Birth Comments: didn't know she was pregnant    Social History   Socioeconomic History  . Marital status: Single    Spouse name: Not on file  . Number of children: Not on file  . Years of education: Not on file  . Highest education level: Not on file  Occupational History  . Not on file  Tobacco Use  . Smoking status: Never Smoker  . Smokeless tobacco: Never Used  Substance and Sexual Activity  . Alcohol use: Not Currently    Comment: ocass  . Drug use: No  . Sexual activity: Yes    Comment: only one partner  Other Topics Concern  . Not on file  Social History Narrative  . Not on file   Social Determinants of Health   Financial Resource Strain:   . Difficulty of Paying Living Expenses: Not on file  Food Insecurity:   . Worried About [redacted]w[redacted]d in the Last Year: Not on file  . Ran Out of Food in the Last Year: Not on file  Transportation Needs:   . Lack of Transportation (Medical): Not on file  . Lack of Transportation (Non-Medical): Not on file  Physical Activity:   . Days of Exercise per Week: Not on file  . Minutes of Exercise per Session: Not on file  Stress:   . Feeling of Stress : Not on file  Social Connections:   . Frequency of Communication with Friends and Family: Not on file  . Frequency of Social Gatherings with Friends and Family: Not on file  . Attends Religious Services: Not on file  . Active Member of Clubs or Organizations: Not on file  . Attends Archivist Meetings: Not on file  . Marital Status: Not on file    Family History  Problem Relation Age of Onset  . COPD Maternal Grandmother   . Hypertension Maternal Grandmother   . Hyperlipidemia Maternal Grandfather   . Hypertension Maternal Grandfather   . COPD Maternal Grandfather   . Diabetes Mother   . Diabetes Father     Facility-Administered Medications Prior to Admission  Medication  Dose Route Frequency Provider Last Rate Last Admin  . permethrin (ELIMITE) 5 % cream   Topical Once Harlin Heys, MD       Medications Prior to Admission  Medication Sig Dispense Refill Last Dose  . ferrous sulfate 325 (65 FE) MG EC tablet Take 325 mg by mouth once.   02/03/2019 at Unknown time  . Prenatal Vit-Fe Fumarate-FA (PRENATAL MULTIVITAMIN) TABS tablet Take 1 tablet by mouth daily at 12 noon.   02/03/2019 at Unknown time    Allergies  Allergen Reactions  . Shrimp [Shellfish Allergy] Rash  . Amoxicillin Rash  . Mushroom Extract Complex Rash    Review of Systems: Negative except for what is mentioned in HPI.  Physical Exam: BP 129/66   Pulse (!) 105   Temp 98.4 F (36.9 C) (Oral)   Resp 16   Ht 5\' 8"  (1.727 m)   Wt (!) 158.8 kg   LMP 04/27/2018   BMI 53.22 kg/m  CONSTITUTIONAL: Well-developed, well-nourished female in no acute distress.  HENT:  Normocephalic, atraumatic, External right and left ear normal. Oropharynx is clear and moist EYES: Conjunctivae and EOM are normal. Pupils are equal, round, and reactive to light. No scleral icterus.  NECK: Normal range of motion, supple, no masses SKIN: Skin is warm and dry. No rash noted. Not diaphoretic. No erythema. No pallor. NEUROLOGIC: Alert and oriented to person, place, and time. Normal reflexes, muscle tone coordination. No cranial nerve deficit noted. PSYCHIATRIC: Normal mood and affect. Normal behavior. Normal judgment and thought content. CARDIOVASCULAR: Normal heart rate noted, regular rhythm RESPIRATORY: Effort and breath sounds normal, no problems with respiration noted ABDOMEN: Soft, nontender, nondistended, gravid. MUSCULOSKELETAL: Normal range of motion. No edema and no tenderness. 2+ distal pulses.  Cervical Exam: Dilatation: funneling present, external os 4-5 cm, internal os 2 cm. Effacement 40-50%   Station -2   Presentation: cephalic FHT:  Baseline rate 140 bpm   Variability moderate  Accelerations  present   Decelerations none Contractions: Every 5-9 mins   Pertinent Labs/Studies:   Results for orders placed or performed during the hospital encounter of 02/03/19 (from the past 24 hour(s))  CBC     Status: Abnormal   Collection Time: 02/03/19  8:26 PM  Result Value Ref Range   WBC 9.9 4.0 - 10.5 K/uL   RBC 4.57 3.87 - 5.11 MIL/uL   Hemoglobin 11.3 (L) 12.0 - 15.0 g/dL   HCT 35.2 (L) 36.0 - 46.0 %   MCV 77.0 (L) 80.0 - 100.0 fL   MCH 24.7 (L) 26.0 - 34.0 pg   MCHC 32.1 30.0 - 36.0 g/dL   RDW 15.2 11.5 - 15.5 %   Platelets 251  150 - 400 K/uL   nRBC 0.0 0.0 - 0.2 %  Type and screen     Status: None (Preliminary result)   Collection Time: 02/03/19  8:26 PM  Result Value Ref Range   ABO/RH(D) PENDING    Antibody Screen PENDING    Sample Expiration      02/06/2019,2359 Performed at [facility], [address] [place], [place] [address]     Lab Results  Component Value Date   WBC 9.9 02/03/2019   HGB 11.3 (L) 02/03/2019   HCT 35.2 (L) 02/03/2019   MCV 77.0 (L) 02/03/2019   PLT 251 02/03/2019      Imaging:  US Fetal BPP W/O Non Stress Patient Name: Robin Cameron DOB: 10/04/1995 MRN: [id number] ULTRASOUND REPORT  Location: [facility] Date of Service: 02/03/2019   Indications: Biophysical Profile performed for indication of morbid  obesity and decreased movement  Findings:  Singleton intrauterine pregnancy is visualized with FHR at 137 BPM.    Fetal presentation is Cephalic.  Placenta: anterior. Grade: 2 AFI: 20 cm  BPP Scoring: Movement: 0/2  Tone: 0/2  Breathing: 2/2  AFI: 2/2  Impression: 1. [redacted]w[redacted]d Viable Singleton Intrauterine pregnancy dated by previously  established criteria. 2. AFI is 20 cm.  3. BPP is 4/8  Recommendations: 1.Clinical correlation with the patient's History and Physical Exam.  Robin  M. Marciano Cameron     RDMS  I have reviewed this study however disagree with documented findings, as  breathing is usually absent prior  to movement or tone becoming absent when  fetal well-being is assessed.  Also, when patient is in lateral  positioning, fetal movement is noted on physical exam.  However in light  of findings, will send to the hospital for further evaluation with NST.   Will manage accordingly.   Hildred Laser, MD [facility]   Assessment : Robin Cameron is a 25 y.o. G3P2002 at [redacted]w[redacted]d being admitted for induction of labor due to gestational HTN, abnormal antenatal testing. Also with morbid obesity. Prior history of fetal macrosomia.  Plan: Labor: Induction as ordered as per protocol with Cytotec. Analgesia as needed. PIH labs ordered in clinic, results reported normal verbally, pending visualization of labs in Epic FWB: Reassuring fetal heart tracing.  GBS negative Delivery plan: Hopeful for vaginal delivery    Hildred Laser, MD [facility]

## 2019-02-03 NOTE — Progress Notes (Signed)
ROB-Pt present for routine prenatal care. Pt stated still having back pain and abd pain. Pt stated having contractions that are irregular and regular.

## 2019-02-04 ENCOUNTER — Encounter: Payer: Self-pay | Admitting: Obstetrics and Gynecology

## 2019-02-04 ENCOUNTER — Inpatient Hospital Stay: Payer: Medicaid Other | Admitting: Anesthesiology

## 2019-02-04 DIAGNOSIS — O99214 Obesity complicating childbirth: Secondary | ICD-10-CM

## 2019-02-04 LAB — PROTEIN / CREATININE RATIO, URINE
Creatinine, Urine: 122.6 mg/dL
Protein, Ur: 15.4 mg/dL
Protein/Creat Ratio: 126 mg/g creat (ref 0–200)

## 2019-02-04 LAB — COMPREHENSIVE METABOLIC PANEL
ALT: 18 IU/L (ref 0–32)
AST: 14 IU/L (ref 0–40)
Albumin/Globulin Ratio: 1.3 (ref 1.2–2.2)
Albumin: 3.4 g/dL — ABNORMAL LOW (ref 3.9–5.0)
Alkaline Phosphatase: 105 IU/L (ref 39–117)
BUN/Creatinine Ratio: 19 (ref 9–23)
BUN: 11 mg/dL (ref 6–20)
Bilirubin Total: <0.2 mg/dL (ref 0.0–1.2)
CO2: 18 mmol/L — ABNORMAL LOW (ref 20–29)
Calcium: 8.8 mg/dL (ref 8.7–10.2)
Chloride: 103 mmol/L (ref 96–106)
Creatinine, Ser: 0.59 mg/dL (ref 0.57–1.00)
GFR calc Af Amer: 149 mL/min/{1.73_m2} (ref >59)
GFR calc non Af Amer: 130 mL/min/{1.73_m2} (ref >59)
Globulin, Total: 2.6 g/dL (ref 1.5–4.5)
Glucose: 105 mg/dL — ABNORMAL HIGH (ref 65–99)
Potassium: 4.1 mmol/L (ref 3.5–5.2)
Sodium: 138 mmol/L (ref 134–144)
Total Protein: 6 g/dL (ref 6.0–8.5)

## 2019-02-04 LAB — CBC
Hematocrit: 31.9 % — ABNORMAL LOW (ref 34.0–46.6)
Hemoglobin: 10.4 g/dL — ABNORMAL LOW (ref 11.1–15.9)
MCH: 24.9 pg — ABNORMAL LOW (ref 26.6–33.0)
MCHC: 32.6 g/dL (ref 31.5–35.7)
MCV: 77 fL — ABNORMAL LOW (ref 79–97)
Platelets: 259 10*3/uL (ref 150–450)
RBC: 4.17 x10E6/uL (ref 3.77–5.28)
RDW: 15.3 % (ref 11.7–15.4)
WBC: 9.6 10*3/uL (ref 3.4–10.8)

## 2019-02-04 LAB — RPR: RPR Ser Ql: NONREACTIVE

## 2019-02-04 MED ORDER — DIPHENHYDRAMINE HCL 25 MG PO CAPS: 25.0000 mg | ORAL_CAPSULE | Freq: Four times a day (QID) | ORAL | Status: DC | PRN

## 2019-02-04 MED ORDER — DIBUCAINE (PERIANAL) 1 % EX OINT: 1.0000 "application " | TOPICAL_OINTMENT | CUTANEOUS | Status: DC | PRN

## 2019-02-04 MED ORDER — WITCH HAZEL-GLYCERIN EX PADS: 1.0000 "application " | MEDICATED_PAD | CUTANEOUS | Status: DC | PRN

## 2019-02-04 MED ORDER — ACETAMINOPHEN 325 MG PO TABS
650.0000 mg | ORAL_TABLET | ORAL | Status: DC | PRN
  Administered 2019-02-04 – 2019-02-06 (×3): 650 mg via ORAL
  Filled 2019-02-04 (×3): qty 2

## 2019-02-04 MED ORDER — LIDOCAINE HCL (PF) 1 % IJ SOLN
INTRAMUSCULAR | Status: AC
  Filled 2019-02-04: qty 30

## 2019-02-04 MED ORDER — EPHEDRINE 5 MG/ML INJ: 10.0000 mg | INTRAVENOUS | Status: DC | PRN

## 2019-02-04 MED ORDER — PHENYLEPHRINE 40 MCG/ML (10ML) SYRINGE FOR IV PUSH (FOR BLOOD PRESSURE SUPPORT): 80.0000 ug | PREFILLED_SYRINGE | INTRAVENOUS | Status: DC | PRN

## 2019-02-04 MED ORDER — LACTATED RINGERS IV SOLN
500.0000 mL | Freq: Once | INTRAVENOUS | Status: AC
  Administered 2019-02-04: 09:00:00 500 mL via INTRAVENOUS

## 2019-02-04 MED ORDER — COCONUT OIL OIL
1.0000 "application " | TOPICAL_OIL | Status: DC | PRN
  Administered 2019-02-05: 1 via TOPICAL
  Filled 2019-02-04: qty 120

## 2019-02-04 MED ORDER — LIDOCAINE-EPINEPHRINE (PF) 1.5 %-1:200000 IJ SOLN
INTRAMUSCULAR | Status: DC | PRN
  Administered 2019-02-04: 3 mL via PERINEURAL

## 2019-02-04 MED ORDER — OXYTOCIN 10 UNIT/ML IJ SOLN
INTRAMUSCULAR | Status: AC
  Filled 2019-02-04: qty 2

## 2019-02-04 MED ORDER — BENZOCAINE-MENTHOL 20-0.5 % EX AERO
1.0000 "application " | INHALATION_SPRAY | CUTANEOUS | Status: DC | PRN
  Filled 2019-02-04: qty 56

## 2019-02-04 MED ORDER — PRENATAL MULTIVITAMIN CH
1.0000 | ORAL_TABLET | Freq: Every day | ORAL | Status: DC
  Administered 2019-02-05: 12:00:00 1 via ORAL
  Filled 2019-02-04: qty 1

## 2019-02-04 MED ORDER — ZOLPIDEM TARTRATE 5 MG PO TABS: 5.0000 mg | ORAL_TABLET | Freq: Every evening | ORAL | Status: DC | PRN

## 2019-02-04 MED ORDER — BREAST MILK/FORMULA (FOR LABEL PRINTING ONLY): ORAL | Status: DC

## 2019-02-04 MED ORDER — ACETAMINOPHEN 325 MG PO TABS
ORAL_TABLET | ORAL | Status: AC
  Administered 2019-02-05: 06:00:00 650 mg via ORAL
  Filled 2019-02-04: qty 2

## 2019-02-04 MED ORDER — MISOPROSTOL 200 MCG PO TABS
ORAL_TABLET | ORAL | Status: AC
  Filled 2019-02-04: qty 4

## 2019-02-04 MED ORDER — ONDANSETRON HCL 4 MG PO TABS: 4.0000 mg | ORAL_TABLET | ORAL | Status: DC | PRN

## 2019-02-04 MED ORDER — DIPHENHYDRAMINE HCL 50 MG/ML IJ SOLN: 12.5000 mg | INTRAMUSCULAR | Status: DC | PRN

## 2019-02-04 MED ORDER — SENNOSIDES-DOCUSATE SODIUM 8.6-50 MG PO TABS
2.0000 | ORAL_TABLET | ORAL | Status: DC
  Administered 2019-02-04: 2 via ORAL
  Filled 2019-02-04: qty 2

## 2019-02-04 MED ORDER — AMMONIA AROMATIC IN INHA
RESPIRATORY_TRACT | Status: AC
  Filled 2019-02-04: qty 10

## 2019-02-04 MED ORDER — ONDANSETRON HCL 4 MG/2ML IJ SOLN: 4.0000 mg | INTRAMUSCULAR | Status: DC | PRN

## 2019-02-04 MED ORDER — VARICELLA VIRUS VACCINE LIVE 1350 PFU/0.5ML IJ SUSR
0.5000 mL | Freq: Once | INTRAMUSCULAR | Status: DC
  Filled 2019-02-04: qty 0.5

## 2019-02-04 MED ORDER — IBUPROFEN 800 MG PO TABS
800.0000 mg | ORAL_TABLET | Freq: Four times a day (QID) | ORAL | Status: DC
  Administered 2019-02-04: 20:00:00 800 mg via ORAL
  Filled 2019-02-04: qty 1

## 2019-02-04 MED ORDER — FENTANYL 2.5 MCG/ML W/ROPIVACAINE 0.15% IN NS 100 ML EPIDURAL (ARMC)
EPIDURAL | Status: AC
  Filled 2019-02-04: qty 100

## 2019-02-04 MED ORDER — BUPIVACAINE HCL (PF) 0.25 % IJ SOLN
INTRAMUSCULAR | Status: DC | PRN
  Administered 2019-02-04: 3 mL via EPIDURAL
  Administered 2019-02-04: 5 mL via EPIDURAL

## 2019-02-04 MED ORDER — FENTANYL 2.5 MCG/ML W/ROPIVACAINE 0.15% IN NS 100 ML EPIDURAL (ARMC)
EPIDURAL | Status: DC | PRN
  Administered 2019-02-04: 12 mL/h via EPIDURAL

## 2019-02-04 MED ORDER — LIDOCAINE HCL (PF) 1 % IJ SOLN
INTRAMUSCULAR | Status: DC | PRN
  Administered 2019-02-04: 3 mL

## 2019-02-04 MED ORDER — FENTANYL 2.5 MCG/ML W/ROPIVACAINE 0.15% IN NS 100 ML EPIDURAL (ARMC): 12.0000 mL/h | EPIDURAL | Status: DC

## 2019-02-04 MED ORDER — SIMETHICONE 80 MG PO CHEW: 80.0000 mg | CHEWABLE_TABLET | ORAL | Status: DC | PRN

## 2019-02-04 NOTE — Anesthesia Procedure Notes (Addendum)
Epidural Patient location during procedure: OB Start time: 02/04/2019 8:23 AM End time: 02/04/2019 8:29 AM  Staffing Anesthesiologist: Piscitello, Cleda Mccreedy, MD Resident/CRNA: Karoline Caldwell, CRNA Performed: resident/CRNA   Preanesthetic Checklist Completed: patient identified, IV checked, site marked, risks and benefits discussed, surgical consent, monitors and equipment checked, pre-op evaluation and timeout performed  Epidural Patient position: sitting Prep: ChloraPrep Patient monitoring: blood pressure and continuous pulse ox Approach: midline Location: L3-L4 Injection technique: LOR air  Needle:  Needle type: Tuohy  Needle gauge: 17 G Needle length: 9 cm and 9 Needle insertion depth: 9.5 cm Catheter type: closed end flexible Catheter size: 19 Gauge Catheter at skin depth: 14 cm Test dose: 1.5% lidocaine with Epi 1:200 K and negative  Assessment Events: blood not aspirated, injection not painful, no injection resistance, no paresthesia and negative IV test  Additional Notes 1 attempt Pt. Evaluated and documentation done after procedure finished. Patient identified. Risks/Benefits/Options discussed with patient including but not limited to bleeding, infection, nerve damage, paralysis, failed block, incomplete pain control, headache, blood pressure changes, nausea, vomiting, reactions to medication both or allergic, itching and postpartum back pain. Confirmed with bedside nurse the patient's most recent platelet count. Confirmed with patient that they are not currently taking any anticoagulation, have any bleeding history or any family history of bleeding disorders. Patient expressed understanding and wished to proceed. All questions were answered. Sterile technique was used throughout the entire procedure. Please see nursing notes for vital signs. Test dose was given through epidural catheter and negative prior to continuing to dose epidural or start infusion. Warning signs of high  block given to the patient including shortness of breath, tingling/numbness in hands, complete motor block, or any concerning symptoms with instructions to call for help. Patient was given instructions on fall risk and not to get out of bed. All questions and concerns addressed with instructions to call with any issues or inadequate analgesia.   Patient tolerated the insertion well without immediate complications.Reason for block:procedure for pain

## 2019-02-04 NOTE — Anesthesia Preprocedure Evaluation (Signed)
Anesthesia Evaluation  Patient identified by MRN, date of birth, ID band Patient awake    Reviewed: Allergy & Precautions, NPO status , Patient's Chart, lab work & pertinent test results  Airway Mallampati: II  TM Distance: >3 FB Neck ROM: full    Dental no notable dental hx.    Pulmonary neg pulmonary ROS,           Cardiovascular hypertension,      Neuro/Psych  Headaches, negative psych ROS   GI/Hepatic Neg liver ROS, GERD  ,  Endo/Other  negative endocrine ROS  Renal/GU negative Renal ROS  negative genitourinary   Musculoskeletal   Abdominal   Peds  Hematology negative hematology ROS (+)   Anesthesia Other Findings   Reproductive/Obstetrics (+) Pregnancy                             Anesthesia Physical Anesthesia Plan  ASA: III  Anesthesia Plan: Epidural   Post-op Pain Management:    Induction:   PONV Risk Score and Plan:   Airway Management Planned:   Additional Equipment:   Intra-op Plan:   Post-operative Plan:   Informed Consent: I have reviewed the patients History and Physical, chart, labs and discussed the procedure including the risks, benefits and alternatives for the proposed anesthesia with the patient or authorized representative who has indicated his/her understanding and acceptance.       Plan Discussed with: Anesthesiologist and CRNA  Anesthesia Plan Comments:         Anesthesia Quick Evaluation

## 2019-02-04 NOTE — Lactation Note (Signed)
This note was copied from a baby's chart. Lactation Consultation Note  Patient Name: Robin Cameron Today's Date: 02/04/2019 Reason for consult: Initial assessment(LGA)  LC called in to assist with first breastfeeding for mom and baby Robin Cameron in L&D room. This is mom's third baby, who is LGA, BS level pending post first feed. Mom reports difficulties with breast/nipple pain and difficult latch with other 2 children. Baby Robin Cameron was skin to skin with mom grunting continuously with eyes and mouth closed, non-cuing. LC suggested moving baby into position to help encourage a latch. LC and dad assisted with bringing baby in cradle position across mom to the left breast, dad supported the breast tissue while LC held and supported baby. Baby continued grunting, with no mouth opening. LC did some breast compressions and hand expressed a drop of colostrum, baby still uninterested. LC removed baby from skin to skin with mom, and performed a digit test to see baby's willingness to suck. Baby accepted the finger easily, however continued grunting and did not sustain a rhythmic sucking for an extended period of time. LC attempted once more to place baby back with mom and encourage a feeding, baby continued to grunt and was then placed upright on mom's chest. Discussed with mom her feeding plan, newborn feeding behaviors, pending BS levels. Mom is open to pumping for stimulation and/or bottle feeding. Encouraged to spend some time skin to skin comforting baby, and LC will reassess shortly. Report made to Transition RN regarding baby's continuously grunting sounds, and unwillingness to eat.  Maternal Data Does the patient have breastfeeding experience prior to this delivery?: Yes  Feeding Feeding Type: Breast Fed  LATCH Score Latch: Too sleepy or reluctant, no latch achieved, no sucking elicited.  Audible Swallowing: None  Type of Nipple: Everted at rest and after stimulation  Comfort (Breast/Nipple): Soft /  non-tender  Hold (Positioning): Assistance needed to correctly position infant at breast and maintain latch.  LATCH Score: 5  Interventions Interventions: Breast feeding basics reviewed;Assisted with latch;Skin to skin;Breast massage;Adjust position;Support pillows  Lactation Tools Discussed/Used     Consult Status Consult Status: Follow-up Date: 02/04/19 Follow-up type: In-patient    Danford Bad 02/04/2019, 1:02 PM

## 2019-02-04 NOTE — Progress Notes (Signed)
Intrapartum Progress Note  S: Patient complaining of more pain with contractions.   O: Blood pressure 129/63, pulse 86, temperature 98.1 F (36.7 C), temperature source Oral, resp. rate 16, height 5\' 8"  (1.727 m), weight (!) 158.8 kg, last menstrual period 04/27/2018. Gen App: NAD, comfortable Abdomen: soft, gravid FHT: baseline 140 bpm.  Accels present.  Decels absent. moderate in degree variability.  Nursing notes difficulty with tracing fetus due to body habitus Tocometer: contractions q 2-4 minutes irregular but at times difficult to trace.   Cervix: 4.5/60-70/-1 after rupture of membranes Extremities: Nontender, no edema.  Pitocin: 26 mIU  Labs:  No new labs  Assessment:  1: SIUP at [redacted]w[redacted]d 2. Gestational HTN 3. Non-reassuring clinic antenatal testing  Plan:  1. Overall fetal status has been reassuring.  Continue induction of labor 2. Patient AROM'd with internal monitors (FSE and IUPC) placed for better assessment of fetal heart rate and contraction pattern. Copius clear fluid noted.  3. Gestational HTN with no treatable severe range BPs. Continue to monitor.  4. Patient desires epidural, will notify Anesthesia.   [redacted]w[redacted]d, MD [facility]   Hildred Laser, MD 02/04/2019 7:40 AM

## 2019-02-05 LAB — CBC
HCT: 28.8 % — ABNORMAL LOW (ref 36.0–46.0)
Hemoglobin: 9.1 g/dL — ABNORMAL LOW (ref 12.0–15.0)
MCH: 24.6 pg — ABNORMAL LOW (ref 26.0–34.0)
MCHC: 31.6 g/dL (ref 30.0–36.0)
MCV: 77.8 fL — ABNORMAL LOW (ref 80.0–100.0)
Platelets: 198 10*3/uL (ref 150–400)
RBC: 3.7 MIL/uL — ABNORMAL LOW (ref 3.87–5.11)
RDW: 15.3 % (ref 11.5–15.5)
WBC: 9 10*3/uL (ref 4.0–10.5)
nRBC: 0 % (ref 0.0–0.2)

## 2019-02-05 MED ORDER — IBUPROFEN 800 MG PO TABS
800.0000 mg | ORAL_TABLET | Freq: Four times a day (QID) | ORAL | Status: DC
  Administered 2019-02-05 – 2019-02-06 (×5): 800 mg via ORAL
  Filled 2019-02-05 (×6): qty 1

## 2019-02-05 MED ORDER — VARICELLA VIRUS VACCINE LIVE 1350 PFU/0.5ML IJ SUSR
0.5000 mL | INTRAMUSCULAR | Status: DC | PRN
  Filled 2019-02-05 (×2): qty 0.5

## 2019-02-05 NOTE — Lactation Note (Addendum)
This note was copied from a baby's chart. Lactation Consultation Note  Patient Name: Robin Cameron Today's Date: 02/05/2019   [facility] followed up with mom of baby this AM. Mom had been set up with DEBP in her room once moved to [facility]. Mom reports only using pump 2 times, pumping 1 breast at a time. She did receive some colostrum, but doesn't feel that it was enough to give baby. LC re-educated parents on the importance of consistency with pumping aiming for a minimum of 8x in 24 hours. Discussed LC support with pumping and fitting of flanges if needed. Parents open to support, and will call soon for help.   Maternal Data    Feeding    LATCH Score                   Interventions    Lactation Tools Discussed/Used     Consult Status      Robin Cameron 02/05/2019, 9:51 AM

## 2019-02-05 NOTE — Plan of Care (Signed)
Vs stable; up ad lib; ambulates to SCN to see baby; taking motrin and tylenol for pain control; tolerating regular diet; breast pump in room; RN has discussed how to use breast pump and how frequent we recommend to use breast pump; RN initially set up breast pump and discussed how to clean breast pump; pt does not have breast pump at home; pt will need breast pump as her baby is in SCN

## 2019-02-05 NOTE — H&P (Signed)
Obstetric History and Physical  Robin Cameron is a 24 y.o. morbidly obese G3P2002 with IUP at [redacted]w[redacted]d presenting from the office secondary to non-reassuring fetal testing (BP 4/8).  Also with gestational HTN, was scheduled for induction of labor tomorrow. Patient states she has been having  irregular contractions, none vaginal bleeding, intact membranes, with active fetal movement.    Prenatal Course Source of Care: [facility] with onset of care at 8 weeks Pregnancy complications or risks: Patient Active Problem List   Diagnosis Date Noted  . Gestational hypertension without significant proteinuria in third trimester 02/03/2019  . Indication for care in labor and delivery, antepartum 12/04/2018  . Morbid obesity with body mass index (BMI) of 50.0 to 59.9 in adult [facility]) 10/08/2018  . Pregnancy, supervision, high-risk, second trimester 10/08/2018  . History of macrosomia in infant in prior pregnancy, currently pregnant 10/08/2018  . Maternal varicella, non-immune 07/09/2018  . GERD (gastroesophageal reflux disease) 12/13/2012  . Elevated blood pressure reading 10/19/2012   She plans to breastfeed She desires unsure method for postpartum contraception.   Prenatal labs and studies: ABO, Rh: --/--/O POS Performed at [facility], [address] [place], [place] [address]  [phone]) Antibody: NEG (01/19 2026) Rubella: 1.23 (06/23 1131) RPR: NON REACTIVE (01/19 2026)  HBsAg: Negative (06/23 1131)  HIV: Non Reactive (06/23 1131)  GBS:--/Negative (01/06 1638) 1 hr Glucola  Normal.  Genetic screening normal Anatomy US normal    Past Medical History:  Diagnosis Date  . Allergic rhinitis   . Obesity     Past Surgical History:  Procedure Laterality Date  . NO PAST SURGERIES    . WISDOM TOOTH EXTRACTION      OB History  Gravida Para Term Preterm AB Living  3 3 3     3   SAB TAB Ectopic Multiple Live Births        0 3    # Outcome Date GA Lbr Len/2nd  Weight Sex Delivery Anes PTL Lv  3 Term 02/04/19 [redacted]w[redacted]d / 00:06 4190 g M Vag-Spont EPI  LIV  2 Term 2015 [redacted]w[redacted]d  4309 g F Vag-Spont  N LIV  1 Term 08/09/12 [redacted]w[redacted]d 00:10 4082 g F Vag-Spont Local  LIV     Birth Comments: didn't know she was pregnant    Social History   Socioeconomic History  . Marital status: Single    Spouse name: Not on file  . Number of children: Not on file  . Years of education: Not on file  . Highest education level: Not on file  Occupational History  . Not on file  Tobacco Use  . Smoking status: Never Smoker  . Smokeless tobacco: Never Used  Substance and Sexual Activity  . Alcohol use: Not Currently    Comment: ocass  . Drug use: No  . Sexual activity: Yes    Comment: only one partner  Other Topics Concern  . Not on file  Social History Narrative  . Not on file   Social Determinants of Health   Financial Resource Strain:   . Difficulty of Paying Living Expenses: Not on file  Food Insecurity:   . Worried About [redacted]w[redacted]d in the Last Year: Not on file  . Ran Out of Food in the Last Year: Not on file  Transportation Needs:   . Lack of Transportation (Medical): Not on file  . Lack of Transportation (Non-Medical): Not on file  Physical Activity:   . Days of Exercise per  Week: Not on file  . Minutes of Exercise per Session: Not on file  Stress:   . Feeling of Stress : Not on file  Social Connections:   . Frequency of Communication with Friends and Family: Not on file  . Frequency of Social Gatherings with Friends and Family: Not on file  . Attends Religious Services: Not on file  . Active Member of Clubs or Organizations: Not on file  . Attends Banker Meetings: Not on file  . Marital Status: Not on file    Family History  Problem Relation Age of Onset  . COPD Maternal Grandmother   . Hypertension Maternal Grandmother   . Hyperlipidemia Maternal Grandfather   . Hypertension Maternal Grandfather   . COPD Maternal  Grandfather   . Diabetes Mother   . Diabetes Father     Facility-Administered Medications Prior to Admission  Medication Dose Route Frequency Provider Last Rate Last Admin  . permethrin (ELIMITE) 5 % cream   Topical Once Linzie Collin, MD       Medications Prior to Admission  Medication Sig Dispense Refill Last Dose  . ferrous sulfate 325 (65 FE) MG EC tablet Take 325 mg by mouth once.   02/03/2019 at Unknown time  . Prenatal Vit-Fe Fumarate-FA (PRENATAL MULTIVITAMIN) TABS tablet Take 1 tablet by mouth daily at 12 noon.   02/03/2019 at Unknown time    Allergies  Allergen Reactions  . Shrimp [Shellfish Allergy] Rash  . Amoxicillin Rash  . Mushroom Extract Complex Rash    Review of Systems: Negative except for what is mentioned in HPI.  Physical Exam: BP 125/87 (BP Location: Left Arm)   Pulse 77   Temp 98.1 F (36.7 C) (Oral)   Resp 20   Ht 5\' 8"  (1.727 m)   Wt (!) 158.8 kg   LMP 04/27/2018   SpO2 99%   Breastfeeding Unknown   BMI 53.22 kg/m  CONSTITUTIONAL: Well-developed, well-nourished female in no acute distress.  HENT:  Normocephalic, atraumatic, External right and left ear normal. Oropharynx is clear and moist EYES: Conjunctivae and EOM are normal. Pupils are equal, round, and reactive to light. No scleral icterus.  NECK: Normal range of motion, supple, no masses SKIN: Skin is warm and dry. No rash noted. Not diaphoretic. No erythema. No pallor. NEUROLOGIC: Alert and oriented to person, place, and time. Normal reflexes, muscle tone coordination. No cranial nerve deficit noted. PSYCHIATRIC: Normal mood and affect. Normal behavior. Normal judgment and thought content. CARDIOVASCULAR: Normal heart rate noted, regular rhythm RESPIRATORY: Effort and breath sounds normal, no problems with respiration noted ABDOMEN: Soft, nontender, nondistended, gravid. MUSCULOSKELETAL: Normal range of motion. No edema and no tenderness. 2+ distal pulses.  Cervical Exam: Dilatation:  funneling present, external os 4-5 cm, internal os 2 cm. Effacement 40-50%   Station -2   Presentation: cephalic FHT:  Baseline rate 140 bpm   Variability moderate  Accelerations present   Decelerations none Contractions: Every 5-9 mins   Pertinent Labs/Studies:   Results for orders placed or performed during the hospital encounter of 02/03/19 (from the past 24 hour(s))  CBC     Status: Abnormal   Collection Time: 02/05/19  6:55 AM  Result Value Ref Range   WBC 9.0 4.0 - 10.5 K/uL   RBC 3.70 (L) 3.87 - 5.11 MIL/uL   Hemoglobin 9.1 (L) 12.0 - 15.0 g/dL   HCT 02/07/19 (L) 13.0 - 86.5 %   MCV 77.8 (L) 80.0 - 100.0 fL  MCH 24.6 (L) 26.0 - 34.0 pg   MCHC 31.6 30.0 - 36.0 g/dL   RDW 15.3 11.5 - 15.5 %   Platelets 198 150 - 400 K/uL   nRBC 0.0 0.0 - 0.2 %    Lab Results  Component Value Date   WBC 9.0 02/05/2019   HGB 9.1 (L) 02/05/2019   HCT 28.8 (L) 02/05/2019   MCV 77.8 (L) 02/05/2019   PLT 198 02/05/2019      Imaging:  US Fetal BPP W/O Non Stress Patient Name: Robin Cameron DOB: [birth year] MRN: [id number] ULTRASOUND REPORT  Location: [facility] Date of Service: 02/03/2019   Indications: Biophysical Profile performed for indication of morbid  obesity and decreased movement  Findings:  Singleton intrauterine pregnancy is visualized with FHR at 137 BPM.    Fetal presentation is Cephalic.  Placenta: anterior. Grade: 2 AFI: 20 cm  BPP Scoring: Movement: 0/2  Tone: 0/2  Breathing: 2/2  AFI: 2/2  Impression: 1. 103w1d Viable Singleton Intrauterine pregnancy dated by previously  established criteria. 2. AFI is 20 cm.  3. BPP is 4/8  Recommendations: 1.Clinical correlation with the patient's History and Physical Exam.  Jenine  M. Albertine Grates     RDMS  I have reviewed this study however disagree with documented findings, as  breathing is usually absent prior to movement or tone becoming absent when  fetal well-being is assessed.  Also, when patient is in lateral   positioning, fetal movement is noted on physical exam.  However in light  of findings, will send to the hospital for further evaluation with NST.   Will manage accordingly.   Rubie Maid, MD [facility]   Assessment : Robin Cameron is a 24 y.o. G3P2002 at [redacted]w[redacted]d being admitted for induction of labor due to gestational HTN, abnormal antenatal testing. Also with morbid obesity. Prior history of fetal macrosomia.  Plan: Labor: Induction as ordered as per protocol with Cytotec. Analgesia as needed. [place] labs ordered in clinic, results reported normal verbally, pending visualization of labs in Epic FWB: Reassuring fetal heart tracing.  GBS negative Delivery plan: Hopeful for vaginal delivery    Rubie Maid, MD [facility]

## 2019-02-05 NOTE — Clinical Social Work Maternal (Signed)
  CLINICAL SOCIAL WORK MATERNAL/CHILD NOTE  Patient Details  Name: Robin Cameron MRN: [id number] Date of Birth: 1995/10/03  Date:  02/05/2019  Clinical Social Worker Initiating Note:  Thea Gist, RNCM Date/Time: Initiated:  02/05/19/0830     Child's Name:  Robin Cameron   Biological Parents:  Mother, Father(Robin Cameron)   Need for Interpreter:  None   Reason for Referral:  Other (Comment)(Depression screen)   Address:  [address] [place] [address]    Phone number:  [phone] (home)     Additional phone number:   Household Members/Support Persons (HM/SP):   Household Member/Support Person 1   HM/SP Name Relationship DOB or Age  HM/SP -1 Robin Cameron significant other    HM/SP -2        HM/SP -3        HM/SP -4        HM/SP -5        HM/SP -6        HM/SP -7        HM/SP -8          Natural Supports (not living in the home):  Immediate Family   Professional Supports:     Employment:     Type of Work:     Education:  Engineer, agricultural   Homebound arranged:    Surveyor, quantity Resources:  Medicaid   Other Resources:  [facility]   Cultural/Religious Considerations Which May Impact Care:    Strengths:  Ability to meet basic needs , Home prepared for child , Understanding of illness, Pediatrician chosen   Psychotropic Medications:         Pediatrician:    [facility]  Pediatrician List:   [facility]    [place]    [place] Other([facility])  [facility]      Pediatrician Fax Number:    Risk Factors/Current Problems:  Other (Comment)(ongoing medical issues of child)   Cognitive State:  Alert , Goal Oriented    Mood/Affect:      CSW Assessment: RNCM assessed MOB and significant other at bedside. MOB resting in bed with massage therapist in room massaging feet. MOB reports to feeling well today and that she thinks she was just very down yesterday with the events of her son  having some problems. She reports to feeling better today and does report she has been treated in the past for depression but has found that natural remedies have worked better for her. FOB present in room, Robin Cameron and is observed to be supportive of MOB. Both parents answering questions about their son and are appropriate in their actions and conversation. MOB reports she is familiar with PPD and has experienced it in the past and that she is very aware of the symptoms and understands how to ask for help. No barriers to discharge noted at this time.   CSW Plan/Description:  No Further Intervention Required/No Barriers to Discharge    Robin Founds, RN 02/05/2019, 1:18 PM

## 2019-02-05 NOTE — Progress Notes (Signed)
Post Partum Day # 1, s/p SVD  Subjective: no complaints, up ad lib, voiding and tolerating PO  Objective: Temp:  [98.1 F (36.7 C)-99.5 F (37.5 C)] 98.1 F (36.7 C) (01/21 0751) Pulse Rate:  [82-129] 86 (01/21 0751) Resp:  [15-21] 20 (01/21 0751) BP: (103-163)/(45-101) 123/68 (01/21 0751) SpO2:  [94 %-100 %] 100 % (01/21 0751)  Physical Exam:  General: alert and no distress  Lungs: clear to auscultation bilaterally Breasts: normal appearance, no masses or tenderness Heart: regular rate and rhythm, S1, S2 normal, no murmur, click, rub or gallop Abdomen: soft, non-tender; bowel sounds normal; no masses,  no organomegaly Pelvis: Lochia: appropriate, Uterine Fundus: firm Extremities: DVT Evaluation: No evidence of DVT seen on physical exam. Negative Homan's sign. No cords or calf tenderness. No significant calf/ankle edema.  Recent Labs    02/03/19 2026 02/05/19 0655  HGB 11.3* 9.1*  HCT 35.2* 28.8*    Assessment/Plan: Doing well postpartum Breastfeeding, Lactation consult,  Circumcision after discharge  Contraception undecided Mild anemia of pregnancy, now postpartum. Will treat with PO iron. Otherwise asymptomatic.  Plan for discharge tomorrow as infant in special care nursery currently.    LOS: 2 days   Hildred Laser, MD [facility] 02/05/2019 8:03 AM

## 2019-02-05 NOTE — Anesthesia Postprocedure Evaluation (Signed)
Anesthesia Post Note  Patient: Museum/gallery exhibitions officer  Procedure(s) Performed: AN AD HOC LABOR EPIDURAL  Patient location during evaluation: Mother Baby Anesthesia Type: Epidural Level of consciousness: awake and alert Pain management: pain level controlled Vital Signs Assessment: post-procedure vital signs reviewed and stable Respiratory status: spontaneous breathing, nonlabored ventilation and respiratory function stable Cardiovascular status: stable Postop Assessment: no headache, no backache and epidural receding Anesthetic complications: no     Last Vitals:  Vitals:   02/04/19 2351 02/05/19 0751  BP: (!) 110/59 123/68  Pulse: 96 86  Resp: 18 20  Temp: 36.7 C 36.7 C  SpO2: 100% 100%    Last Pain:  Vitals:   02/05/19 0751  TempSrc: Oral  PainSc:                  Rica Mast

## 2019-02-06 MED ORDER — DOCUSATE SODIUM 100 MG PO CAPS: 100.0000 mg | ORAL_CAPSULE | Freq: Every day | ORAL | 2 refills | Status: AC | PRN

## 2019-02-06 MED ORDER — IBUPROFEN 800 MG PO TABS: 800.0000 mg | ORAL_TABLET | Freq: Three times a day (TID) | ORAL | 0 refills | Status: DC | PRN

## 2019-02-06 NOTE — Lactation Note (Signed)
Parents visiting baby in [facility], mom states she spoke with [place] [facility] and plans on going to [facility] after D/C to obtain an electric breast pump.

## 2019-02-06 NOTE — Discharge Instructions (Signed)
Postpartum Baby Blues The postpartum period begins right after the birth of a baby. During this time, there is often a lot of joy and excitement. It is also a time of many changes in the life of the parents. No matter how many times a mother gives birth, each child brings new challenges to the family, including different ways of relating to one another. It is common to have feelings of excitement along with confusing changes in moods, emotions, and thoughts. You may feel happy one minute and sad or stressed the next. These feelings of sadness usually happen in the period right after you have your baby, and they go away within a week or two. This is called the "baby blues." What are the causes? There is no known cause of baby blues. It is likely caused by a combination of factors. However, changes in hormone levels after childbirth are believed to trigger some of the symptoms. Other factors that can play a role in these mood changes include:  Lack of sleep.  Stressful life events, such as poverty, caring for a loved one, or death of a loved one.  Genetics. What are the signs or symptoms? Symptoms of this condition include:  Brief changes in mood, such as going from extreme happiness to sadness.  Decreased concentration.  Difficulty sleeping.  Crying spells and tearfulness.  Loss of appetite.  Irritability.  Anxiety. If the symptoms of baby blues last for more than 2 weeks or become more severe, you may have postpartum depression. How is this diagnosed? This condition is diagnosed based on an evaluation of your symptoms. There are no medical or lab tests that lead to a diagnosis, but there are various questionnaires that a health care provider may use to identify women with the baby blues or postpartum depression. How is this treated? Treatment is not needed for this condition. The baby blues usually go away on their own in 1-2 weeks. Social support is often all that is needed. You will  be encouraged to get adequate sleep and rest. Follow these instructions at home: Lifestyle      Get as much rest as you can. Take a nap when the baby sleeps.  Exercise regularly as told by your health care provider. Some women find yoga and walking to be helpful.  Eat a balanced and nourishing diet. This includes plenty of fruits and vegetables, whole grains, and lean proteins.  Do little things that you enjoy. Have a cup of tea, take a bubble bath, read your favorite magazine, or listen to your favorite music.  Avoid alcohol.  Ask for help with household chores, cooking, grocery shopping, or running errands. Do not try to do everything yourself. Consider hiring a postpartum doula to help. This is a professional who specializes in providing support to new mothers.  Try not to make any major life changes during pregnancy or right after giving birth. This can add stress. General instructions  Talk to people close to you about how you are feeling. Get support from your partner, family members, friends, or other new moms. You may want to join a support group.  Find ways to cope with stress. This may include: ? Writing your thoughts and feelings in a journal. ? Spending time outside. ? Spending time with people who make you laugh.  Try to stay positive in how you think. Think about the things you are grateful for.  Take over-the-counter and prescription medicines only as told by your health care provider.    Let your health care provider know if you have any concerns.  Keep all postpartum visits as told by your health care provider. This is important. Contact a health care provider if:  Your baby blues do not go away after 2 weeks. Get help right away if:  You have thoughts of taking your own life (suicidal thoughts).  You think you may harm the baby or other people.  You see or hear things that are not there (hallucinations). Summary  After giving birth, you may feel happy  one minute and sad or stressed the next. Feelings of sadness that happen right after the baby is born and go away after a week or two are called the "baby blues."  You can manage the baby blues by getting enough rest, eating a healthy diet, exercising, spending time with supportive people, and finding ways to cope with stress.  If feelings of sadness and stress last longer than 2 weeks or get in the way of caring for your baby, talk to your health care provider. This may mean you have postpartum depression. This information is not intended to replace advice given to you by your health care provider. Make sure you discuss any questions you have with your health care provider. Document Revised: 04/25/2018 Document Reviewed: 02/28/2016 Elsevier Patient Education  2020 [employer]   Postpartum Care After Vaginal Delivery This sheet gives you information about how to care for yourself from the time you deliver your baby to up to 6-12 weeks after delivery (postpartum period). Your health care provider may also give you more specific instructions. If you have problems or questions, contact your health care provider. Follow these instructions at home: Vaginal bleeding  It is normal to have vaginal bleeding (lochia) after delivery. Wear a sanitary pad for vaginal bleeding and discharge. ? During the first week after delivery, the amount and appearance of lochia is often similar to a menstrual period. ? Over the next few weeks, it will gradually decrease to a dry, yellow-brown discharge. ? For most women, lochia stops completely by 4-6 weeks after delivery. Vaginal bleeding can vary from woman to woman.  Change your sanitary pads frequently. Watch for any changes in your flow, such as: ? A sudden increase in volume. ? A change in color. ? Large blood clots.  If you pass a blood clot from your vagina, save it and call your health care provider to discuss. Do not flush blood clots down the toilet  before talking with your health care provider.  Do not use tampons or douches until your health care provider says this is safe.  If you are not breastfeeding, your period should return 6-8 weeks after delivery. If you are feeding your child breast milk only (exclusive breastfeeding), your period may not return until you stop breastfeeding. Perineal care  Keep the area between the vagina and the anus (perineum) clean and dry as told by your health care provider. Use medicated pads and pain-relieving sprays and creams as directed.  If you had a cut in the perineum (episiotomy) or a tear in the vagina, check the area for signs of infection until you are healed. Check for: ? More redness, swelling, or pain. ? Fluid or blood coming from the cut or tear. ? Warmth. ? Pus or a bad smell.  You may be given a squirt bottle to use instead of wiping to clean the perineum area after you go to the bathroom. As you start healing, you may use the   before wiping yourself. Make sure to wipe gently.  To relieve pain caused by an episiotomy, a tear in the vagina, or swollen veins in the anus (hemorrhoids), try taking a warm sitz bath 2-3 times a day. A sitz bath is a warm water bath that is taken while you are sitting down. The water should only come up to your hips and should cover your buttocks. Breast care  Within the first few days after delivery, your breasts may feel heavy, full, and uncomfortable (breast engorgement). Milk may also leak from your breasts. Your health care provider can suggest ways to help relieve the discomfort. Breast engorgement should go away within a few days.  If you are breastfeeding: ? Wear a bra that supports your breasts and fits you well. ? Keep your nipples clean and dry. Apply creams and ointments as told by your health care provider. ? You may need to use breast pads to absorb milk that leaks from your breasts. ? You may have uterine contractions every time you  breastfeed for up to several weeks after delivery. Uterine contractions help your uterus return to its normal size. ? If you have any problems with breastfeeding, work with your health care provider or Advertising copywriter.  If you are not breastfeeding: ? Avoid touching your breasts a lot. Doing this can make your breasts produce more milk. ? Wear a good-fitting bra and use cold packs to help with swelling. ? Do not squeeze out (express) milk. This causes you to make more milk. Intimacy and sexuality  Ask your health care provider when you can engage in sexual activity. This may depend on: ? Your risk of infection. ? How fast you are healing. ? Your comfort and desire to engage in sexual activity.  You are able to get pregnant after delivery, even if you have not had your period. If desired, talk with your health care provider about methods of birth control (contraception). Medicines  Take over-the-counter and prescription medicines only as told by your health care provider.  If you were prescribed an antibiotic medicine, take it as told by your health care provider. Do not stop taking the antibiotic even if you start to feel better. Activity  Gradually return to your normal activities as told by your health care provider. Ask your health care provider what activities are safe for you.  Rest as much as possible. Try to rest or take a nap while your baby is sleeping. Eating and drinking   Drink enough fluid to keep your urine pale yellow.  Eat high-fiber foods every day. These may help prevent or relieve constipation. High-fiber foods include: ? Whole grain cereals and breads. ? Brown rice. ? Beans. ? Fresh fruits and vegetables.  Do not try to lose weight quickly by cutting back on calories.  Take your prenatal vitamins until your postpartum checkup or until your health care provider tells you it is okay to stop. Lifestyle  Do not use any products that contain nicotine or  tobacco, such as cigarettes and e-cigarettes. If you need help quitting, ask your health care provider.  Do not drink alcohol, especially if you are breastfeeding. General instructions  Keep all follow-up visits for you and your baby as told by your health care provider. Most women visit their health care provider for a postpartum checkup within the first 3-6 weeks after delivery. Contact a health care provider if:  You feel unable to cope with the changes that your child brings to your life,  and these feelings do not go away.  You feel unusually sad or worried.  Your breasts become red, painful, or hard.  You have a fever.  You have trouble holding urine or keeping urine from leaking.  You have little or no interest in activities you used to enjoy.  You have not breastfed at all and you have not had a menstrual period for 12 weeks after delivery.  You have stopped breastfeeding and you have not had a menstrual period for 12 weeks after you stopped breastfeeding.  You have questions about caring for yourself or your baby.  You pass a blood clot from your vagina. Get help right away if:  You have chest pain.  You have difficulty breathing.  You have sudden, severe leg pain.  You have severe pain or cramping in your lower abdomen.  You bleed from your vagina so much that you fill more than one sanitary pad in one hour. Bleeding should not be heavier than your heaviest period.  You develop a severe headache.  You faint.  You have blurred vision or spots in your vision.  You have bad-smelling vaginal discharge.  You have thoughts about hurting yourself or your baby. If you ever feel like you may hurt yourself or others, or have thoughts about taking your own life, get help right away. You can go to the nearest emergency department or call:  Your local emergency services (911 in the [place]  A suicide crisis helpline, such as the [facility] at  409-791-9356. This is open 24 hours a day. Summary  The period of time right after you deliver your newborn up to 6-12 weeks after delivery is called the postpartum period.  Gradually return to your normal activities as told by your health care provider.  Keep all follow-up visits for you and your baby as told by your health care provider. This information is not intended to replace advice given to you by your health care provider. Make sure you discuss any questions you have with your health care provider. Document Revised: 01/04/2017 Document Reviewed: 10/15/2016 Elsevier Patient Education  2020 [employer].

## 2019-02-06 NOTE — Discharge Summary (Signed)
OB Discharge Summary     Patient Name: Robin Cameron DOB: 1995/10/30 MRN: [id number]  Date of admission: 02/03/2019 Delivering MD: Hildred Laser   Date of discharge: 02/06/2019  Admitting diagnosis: Non-reassuring antenatal testing Intrauterine pregnancy: [redacted]w[redacted]d     Secondary diagnosis:  Active Problems:   Gestational hypertension without significant proteinuria in third trimester Morbid Obesity, BMI 50-59.9 History of fetal macrosomia in prior pregnancy  Additional problems: Maternal Varicella non-immune     Discharge diagnosis: Term Pregnancy Delivered and Anemia                                                                                                Post partum procedures: Varivax vaccine  Augmentation: AROM, Pitocin and Foley Balloon  Complications: None  Hospital course:  Induction of Labor With Vaginal Delivery   24 y.o. yo G3P3003 at [redacted]w[redacted]d was admitted to the hospital 02/03/2019 for induction of labor.  Indication for induction: Gestational hypertension and Non-reassuring antenatal testing .  Patient had an uncomplicated labor course as follows: Membrane Rupture Time/Date: 7:26 AM ,02/04/2019   Intrapartum Procedures: Episiotomy: None [1]                                         Lacerations:  None [1]  Patient had delivery of a Viable infant.  02/04/2019   Delivery Summary for [employer]  Labor Events:   Preterm labor: No data found  Rupture date: 02/04/2019  Rupture time: 7:26 AM  Rupture type: Artificial Intact Possible ROM - for evaluation  Fluid Color: Clear  Induction: No data found  Augmentation: No data found  Complications: No data found  Cervical ripening: No data found No data found   No data found     Delivery:   Episiotomy: No data found  Lacerations: No data found  Repair suture: No data found  Repair # of packets: No data found  Blood loss (ml): 500   Information for the patient's newborn:  Porshea, Janowski [[id number]]     Delivery 02/04/2019 11:04 AM by  Vaginal, Spontaneous Sex:  female Gestational Age: [redacted]w[redacted]d Delivery Clinician:   Living?:         APGARS  One minute Five minutes Ten minutes  Skin color:        Heart rate:        Grimace:        Muscle tone:        Breathing:        Totals: 8  9      Presentation/position:      Resuscitation:   Cord information:    Disposition of cord blood:     Blood gases sent?  Complications:   Placenta: Delivered:       appearance Newborn Measurements: Weight: 9 lb 3.8 oz (4190 g)  Height: 21"  Head circumference:    Chest circumference:    Other providers:    Additional  information: Forceps:   Vacuum:   Breech:   Observed  anomalies         Details of delivery can be found in separate delivery note.  Patient had a routine postpartum course. Patient is discharged home 02/06/19.  Physical exam  Vitals:   02/05/19 0751 02/05/19 1618 02/05/19 2358 02/06/19 0734  BP: 123/68 125/87 113/65 122/79  Pulse: 86 77 86 74  Resp: 20  20 20   Temp: 98.1 F (36.7 C)  98 F (36.7 C) 98.7 F (37.1 C)  TempSrc: Oral  Oral Axillary  SpO2: 100% 99% 98% 100%  Weight:      Height:       General: alert and no distress Lochia: appropriate Uterine Fundus: firm Incision: Healing well with no significant drainage, No significant erythema, Dressing is clean, dry, and intact.  DVT Evaluation: No evidence of DVT seen on physical exam. Negative Homan's sign. No cords or calf tenderness. No significant calf/ankle edema.    Labs: CBC Latest Ref Rng & Units 02/05/2019 02/03/2019 02/03/2019  WBC 4.0 - 10.5 K/uL 9.0 9.9 9.6  Hemoglobin 12.0 - 15.0 g/dL 9.1(L) 11.3(L) 10.4(L)  Hematocrit 36.0 - 46.0 % 28.8(L) 35.2(L) 31.9(L)  Platelets 150 - 400 K/uL 198 251 259     CMP Latest Ref Rng & Units 02/03/2019  Glucose 65 - 99 mg/dL 105(H)  BUN 6 - 20 mg/dL 11  Creatinine 0.57 - 1.00 mg/dL 0.59  Sodium 134 - 144 mmol/L 138  Potassium 3.5 - 5.2 mmol/L 4.1  Chloride 96 -  106 mmol/L 103  CO2 20 - 29 mmol/L 18(L)  Calcium 8.7 - 10.2 mg/dL 8.8  Total Protein 6.0 - 8.5 g/dL 6.0  Total Bilirubin 0.0 - 1.2 mg/dL <0.2  Alkaline Phos 39 - 117 IU/L 105  AST 0 - 40 IU/L 14  ALT 0 - 32 IU/L 18    Discharge instruction: per After Visit Summary.  After visit meds:  Allergies as of 02/06/2019       Reactions   Shrimp [shellfish Allergy] Rash   Amoxicillin Rash   Mushroom Extract Complex Rash        Medication List     TAKE these medications    docusate sodium 100 MG capsule Commonly known as: Colace Take 1 capsule (100 mg total) by mouth daily as needed.   ferrous sulfate 325 (65 FE) MG EC tablet Take 325 mg by mouth once.   ibuprofen 800 MG tablet Commonly known as: ADVIL Take 1 tablet (800 mg total) by mouth every 8 (eight) hours as needed.   prenatal multivitamin Tabs tablet Take 1 tablet by mouth daily at 12 noon.        Diet: routine diet  Activity: Advance as tolerated. Pelvic rest for 6 weeks.   Outpatient follow up:6 weeks Follow up Appt:No future appointments. Follow up Visit:No follow-ups on file.  Postpartum contraception: Undecided  Newborn Data: Live born female  Birth Weight: 9 lb 3.8 oz (4190 g) APGAR: 8, 9  Newborn Delivery   Birth date/time: 02/04/2019 11:04:00 Delivery type: Vaginal, Spontaneous      Baby Feeding: Breast Disposition:NICU   02/06/2019 Rubie Maid, MD

## 2019-02-06 NOTE — Progress Notes (Signed)
Pt discharged, infant in SCN.  Discharge instructions, prescriptions and follow up appointment given to and reviewed with pt. Pt verbalized understanding. Escorted out by staff.

## 2019-02-06 NOTE — Lactation Note (Signed)
Spoke with mom and dad about obtaining an electric breast pump through [facility], given [facility] phone no., mom not currently signed up on [facility] but has been on [facility] in [place] With last child, I called [facility] and notified then that pt would be calling and would need an electric pump post discharge, I also faxed [facility]IC a referral form, encouraged mom to continue pumping breasts every 3 hours when she goes home today, we discussed breast milk storage and I showed her  Information about breast milk storage that is included in the Mother Baby Care handout.  She is currently getting small amts (drops) of colostrum when she pumps, encouraged that this amount she is pumping is normal, she had the strength of suction increased and this was uncomfortable to her, so I decreased strength and recommended that she use a suction strength that was comfortable to her.

## 2019-03-18 ENCOUNTER — Other Ambulatory Visit: Payer: Self-pay

## 2019-03-18 ENCOUNTER — Ambulatory Visit (INDEPENDENT_AMBULATORY_CARE_PROVIDER_SITE_OTHER): Payer: Medicaid Other | Admitting: Obstetrics and Gynecology

## 2019-03-18 ENCOUNTER — Encounter: Payer: Self-pay | Admitting: Obstetrics and Gynecology

## 2019-03-18 DIAGNOSIS — F53 Postpartum depression: Secondary | ICD-10-CM

## 2019-03-18 DIAGNOSIS — O99345 Other mental disorders complicating the puerperium: Secondary | ICD-10-CM

## 2019-03-18 DIAGNOSIS — O9081 Anemia of the puerperium: Secondary | ICD-10-CM

## 2019-03-18 MED ORDER — SERTRALINE HCL 50 MG PO TABS: 50.0000 mg | ORAL_TABLET | Freq: Every day | ORAL | 6 refills | Status: DC

## 2019-03-18 NOTE — Patient Instructions (Signed)

## 2019-03-18 NOTE — Progress Notes (Signed)
PT is present today for her postpartum visit. Pt stated that she is breastfeeding and have not had sexually intercourse recently. Pt stated that she is unsure of any form of BC at this time. *EPDS= 20.  Pt stated that she is doing well no complaints.

## 2019-03-18 NOTE — Progress Notes (Signed)
   OBSTETRICS POSTPARTUM CLINIC PROGRESS NOTE  Subjective:     Robin Cameron is a 24 y.o. [phone] female who presents for a postpartum visit. She is 6 weeks postpartum following a spontaneous vaginal delivery. I have fully reviewed the prenatal and intrapartum course. The delivery was at 39 gestational weeks.  Anesthesia: epidural. Postpartum course has been well. Baby's course has been well. Baby is feeding by both breast and bottle - Similac Advance. Bleeding: patient has not resumed menses, with No LMP recorded. Bowel function is normal. Bladder function is normal. Patient is not sexually active. Contraception method desired is undecided. Postpartum depression screening: positive (EPDS score is 20).  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 Is trying to cope with exercise, adult coloring books.   The following portions of the patient's history were reviewed and updated as appropriate: allergies, current medications, past family history, past medical history, past social history, past surgical history and problem list.  Review of Systems Pertinent items noted in HPI and remainder of comprehensive ROS otherwise negative.   Objective:    BP 118/71   Pulse 67   Ht 5\' 8"  (1.727 m)   Wt (!) 338 lb 1.6 oz (153.4 kg)   Breastfeeding Yes   BMI 51.41 kg/m   General:  alert and no distress   Breasts:  inspection negative, no nipple discharge or bleeding, no masses or nodularity palpable  Lungs: clear to auscultation bilaterally  Heart:  regular rate and rhythm, S1, S2 normal, no murmur, click, rub or gallop  Abdomen: soft, non-tender; bowel sounds normal; no masses,  no organomegaly.     Vulva:  normal  Vagina: normal vagina, no discharge, exudate, lesion, or erythema  Cervix:  no cervical motion tenderness and no lesions  Corpus: normal size, contour, position, consistency, mobility, non-tender  Adnexa:   normal adnexa and no mass, fullness, tenderness  Rectal Exam: Not performed.         Labs:  Lab Results  Component Value Date   HGB 9.1 (L) 02/05/2019     Assessment:   Postpartum care following vaginal delivery Postpartum depression Postpartum anemia  Plan:    1. Contraception: undecided.  Currently still abstinent since delivery. Desires to think about her options. Will f/u in 1 month 2. Will check Hgb for h/o anemia.  3. Discussion had with patient today regarding symptoms.  Likely suffering from postpartum depression and social/family stressors.  Currently no SI/HI.  Patient notes that she does not have good emotional support at home (notes FOB works a lot, grandmother has back issues and can't help with her children as she used to).  Two older children are now in daycare. Discussed possibility of finding jobs where she can work from home. Would like to resume school, but cannot financially at this time. Discussed options for management of depression, including counseling and/or medications, or both.  Patient desires medication for now, notes trying counseling once in the past, did not like it. Will prescribe Zoloft 50 mg.  4. Follow up in: 4 weeks to f/u symptoms.   02/07/2019, MD [facility]

## 2019-04-15 ENCOUNTER — Encounter: Payer: Self-pay | Admitting: Obstetrics and Gynecology

## 2019-04-15 ENCOUNTER — Ambulatory Visit (INDEPENDENT_AMBULATORY_CARE_PROVIDER_SITE_OTHER): Payer: Medicaid Other | Admitting: Obstetrics and Gynecology

## 2019-04-15 ENCOUNTER — Other Ambulatory Visit: Payer: Self-pay

## 2019-04-15 VITALS — BP 123/73 | HR 55 | Ht 68.0 in | Wt 334.0 lb

## 2019-04-15 DIAGNOSIS — F53 Postpartum depression: Secondary | ICD-10-CM

## 2019-04-15 DIAGNOSIS — F419 Anxiety disorder, unspecified: Secondary | ICD-10-CM

## 2019-04-15 DIAGNOSIS — Z3009 Encounter for other general counseling and advice on contraception: Secondary | ICD-10-CM | POA: Diagnosis not present

## 2019-04-15 DIAGNOSIS — O99345 Other mental disorders complicating the puerperium: Secondary | ICD-10-CM

## 2019-04-15 MED ORDER — SERTRALINE HCL 100 MG PO TABS: 100.0000 mg | ORAL_TABLET | Freq: Every day | ORAL | 3 refills | Status: DC

## 2019-04-15 NOTE — Progress Notes (Signed)
    GYNECOLOGY PROGRESS NOTE  Subjective:    Patient ID: Robin Cameron, female    DOB: Sep 06, 1995, 24 y.o.   MRN: [id number]  HPI  Patient is a 24 y.o. G26P3003 female who presents for 4 week follow up of postpartum depression. She was initiated on Zoloft 50 mg last visit. Overall patient notes that she is feeling somewhat better on the medication, but thinks she needs to increase her dose. Also notes that has been feeling some anxiety lately.  Additionally, patient was to present today for further discussion of contraception. Notes that she still has not decided which method she desires.  Still has not been sexually active since the birth of her child in January.   The following portions of the patient's history were reviewed and updated as appropriate: allergies, current medications, past family history, past medical history, past social history, past surgical history and problem list.  Review of Systems Pertinent items noted in HPI and remainder of comprehensive ROS otherwise negative.   Objective:   Blood pressure 123/73, pulse (!) 55, height 5\' 8"  (1.727 m), weight (!) 334 lb (151.5 kg), last menstrual period 03/27/2019, currently breastfeeding. General appearance: alert and no distress Psychologic: normal mood, normal affect.    Depression screen [facility] 2/9 04/15/2019  Decreased Interest 0  Down, Depressed, Hopeless 1  PHQ - 2 Score 1  Altered sleeping 3  Tired, decreased energy 3  Change in appetite 2  Feeling bad or failure about yourself  3  Trouble concentrating 0  Moving slowly or fidgety/restless 3  Suicidal thoughts 0  PHQ-9 Score 15  Difficult doing work/chores Somewhat difficult     GAD 7 : Generalized Anxiety Score 04/15/2019  Nervous, Anxious, on Edge 0  Control/stop worrying 0  Worry too much - different things 1  Trouble relaxing 2  Restless 3  Easily annoyed or irritable 0  Afraid - awful might happen 2  Total GAD 7 Score 8  Anxiety Difficulty Somewhat  difficult     GAD 7 : Generalized Anxiety Score 04/15/2019  Nervous, Anxious, on Edge 0  Control/stop worrying 0  Worry too much - different things 1  Trouble relaxing 2  Restless 3  Easily annoyed or irritable 0  Afraid - awful might happen 2  Total GAD 7 Score 8  Anxiety Difficulty Somewhat difficult     Assessment:   1. Postpartum depression   2. Anxiety   3. Encounter for other general counseling or advice on contraception    Plan:   1.  Postpartum depression and anxiety - patient currently on Zoloft 50 mg. Will increase to 100 mg. To follow up in 4 weeks to reassess symptoms.  2. Discussed contraception options again with patient.  Patient has used Depo Provera in the past which caused weight gain, and did not like IUD due to cramping. Discussed other options including OCPs, Nexplanon, contraceptive patch, and NuvaRing (however discussed risk of decreased effectiveness due to BMI).  Lastly discussed options of condoms, rhythm method, and Phexxi.  Patient still unsure at this time.  Given handout again to review and discuss with her partner. Will f/u again in 1 month.    A total of 15 minutes were spent face-to-face with the patient during this encounter and over half of that time dealt with counseling and coordination of care.   04/17/2019, MD [facility]

## 2019-04-15 NOTE — Progress Notes (Signed)
Pt present for follow up for post partum depression. Pt stated that she has noticed a change in her anxiety and depression since starting the Zoloft on 03/18/2019. Pt is would like to start birth control but is unsure of what form she wanted.  PHQ-9=15.   GAD-7=8.

## 2019-05-13 ENCOUNTER — Encounter: Payer: Medicaid Other | Admitting: Obstetrics and Gynecology

## 2019-08-05 ENCOUNTER — Emergency Department: Payer: Medicaid Other

## 2019-08-05 ENCOUNTER — Emergency Department
Admission: EM | Admit: 2019-08-05 | Discharge: 2019-08-05 | Disposition: A | Discharge status: Home / Self Care | Payer: Medicaid Other | Attending: Emergency Medicine | Admitting: Emergency Medicine

## 2019-08-05 ENCOUNTER — Other Ambulatory Visit: Payer: Self-pay

## 2019-08-05 DIAGNOSIS — Y9289 Other specified places as the place of occurrence of the external cause: Secondary | ICD-10-CM | POA: Insufficient documentation

## 2019-08-05 DIAGNOSIS — Y990 Civilian activity done for income or pay: Secondary | ICD-10-CM | POA: Diagnosis not present

## 2019-08-05 DIAGNOSIS — X501XXA Overexertion from prolonged static or awkward postures, initial encounter: Secondary | ICD-10-CM | POA: Insufficient documentation

## 2019-08-05 DIAGNOSIS — S93402A Sprain of unspecified ligament of left ankle, initial encounter: Secondary | ICD-10-CM | POA: Insufficient documentation

## 2019-08-05 DIAGNOSIS — Y9389 Activity, other specified: Secondary | ICD-10-CM | POA: Diagnosis not present

## 2019-08-05 DIAGNOSIS — S99912A Unspecified injury of left ankle, initial encounter: Secondary | ICD-10-CM | POA: Diagnosis present

## 2019-08-05 MED ORDER — HYDROCODONE-ACETAMINOPHEN 5-325 MG PO TABS
1.0000 | ORAL_TABLET | Freq: Once | ORAL | Status: AC
  Administered 2019-08-05: 1 via ORAL
  Filled 2019-08-05: qty 1

## 2019-08-05 NOTE — ED Triage Notes (Signed)
Pt was at work and she twisted her left ankle as she stepped off a curve. Pt has hx of fx to the same. Swelling noted to ankle, increased pain during ambulation.

## 2019-08-05 NOTE — ED Provider Notes (Signed)
[facility] Emergency Department Provider Note ____________________________________________  Time seen: 2237  I have reviewed the triage vital signs and the nursing notes.  HISTORY  Chief Complaint  Ankle Pain  HPI Robin Cameron is a 24 y.o. female presents herself to the ED for evaluation of acute left ankle pain and swelling.  She localizes pain to the medial aspect of the foot.  Patient describes mechanical injury where she twisted the foot she stepped off the curb earlier today.   Patient gives remote history of ankle fracture in the past.  She denies any other injury at this time.  Past Medical History:  Diagnosis Date  . Allergic rhinitis   . History of gestational hypertension 02/03/2019  . Obesity     Patient Active Problem List   Diagnosis Date Noted  . Morbid obesity with body mass index (BMI) of 50.0 to 59.9 in adult [facility]) 10/08/2018  . GERD (gastroesophageal reflux disease) 12/13/2012    Past Surgical History:  Procedure Laterality Date  . NO PAST SURGERIES    . WISDOM TOOTH EXTRACTION      Prior to Admission medications   Medication Sig Start Date End Date Taking? Authorizing Provider  docusate sodium (COLACE) 100 MG capsule Take 1 capsule (100 mg total) by mouth daily as needed. 02/06/19 02/06/20  Hildred Laser, MD  ferrous sulfate 325 (65 FE) MG EC tablet Take 325 mg by mouth once.    [provider]  ibuprofen (ADVIL) 800 MG tablet Take 1 tablet (800 mg total) by mouth every 8 (eight) hours as needed. 02/06/19   Hildred Laser, MD  Prenatal Vit-Fe Fumarate-FA (PRENATAL MULTIVITAMIN) TABS tablet Take 1 tablet by mouth daily at 12 noon.    [provider]  sertraline (ZOLOFT) 100 MG tablet Take 1 tablet (100 mg total) by mouth daily. 04/15/19   Hildred Laser, MD    Allergies Shrimp [shellfish allergy], Amoxicillin, and Mushroom extract complex  Family History  Problem Relation Age of Onset  . COPD Maternal Grandmother   .  Hypertension Maternal Grandmother   . Hyperlipidemia Maternal Grandfather   . Hypertension Maternal Grandfather   . COPD Maternal Grandfather   . Diabetes Mother   . Diabetes Father     Social History Social History   Tobacco Use  . Smoking status: Never Smoker  . Smokeless tobacco: Never Used  Vaping Use  . Vaping Use: Never used  Substance Use Topics  . Alcohol use: Not Currently    Comment: ocass  . Drug use: No    Review of Systems  Constitutional: Negative for fever. Cardiovascular: Negative for chest pain. Respiratory: Negative for shortness of breath. Gastrointestinal: Negative for abdominal pain, vomiting and diarrhea. Musculoskeletal: Negative for back pain.  Left ankle pain and swelling as above. Skin: Negative for rash. Neurological: Negative for headaches, focal weakness or numbness. ____________________________________________  PHYSICAL EXAM:  VITAL SIGNS: ED Triage Vitals  Enc Vitals Group     BP 08/05/19 2156 139/89     Pulse Rate 08/05/19 2156 94     Resp 08/05/19 2156 18     Temp 08/05/19 2156 99.4 F (37.4 C)     Temp Source 08/05/19 2156 Oral     SpO2 08/05/19 2156 98 %     Weight 08/05/19 2156 (!) 323 lb (146.5 kg)     Height 08/05/19 2156 5\' 6"  (1.676 m)     Head Circumference --      Peak Flow --  Pain Score 08/05/19 2159 8     Pain Loc --      Pain Edu? --      Excl. in GC? --     Constitutional: Alert and oriented. Well appearing and in no distress. Head: Normocephalic and atraumatic. Eyes: Conjunctivae are normal. Normal extraocular movements Cardiovascular: Normal rate, regular rhythm. Normal distal pulses. Respiratory: Normal respiratory effort. No wheezes/rales/rhonchi. Gastrointestinal: Soft and nontender. No distention. Musculoskeletal: Left foot and ankle without obvious deformity or dislocation.  Patient mildly tender to palpation to the medial ankle at the navicular.  She is also mildly tender to the lateral malleolus.   Negative anterior/posterior drawer sign.  No significant calf or Achilles tenderness is elicited.  Nontender with normal range of motion in all extremities.  Neurologic:  Normal gross sensation.  Normal speech and language. No gross focal neurologic deficits are appreciated. Skin:  Skin is warm, dry and intact. No rash noted. ___________________________________________   RADIOLOGY  DG Left Ankle  IMPRESSION: Negative. ____________________________________________  PROCEDURES  ASO ankle brace Crutches Norco 5-325 mg PO  Procedures ____________________________________________  INITIAL IMPRESSION / ASSESSMENT AND PLAN / ED COURSE  Patient with ED evaluation of acute ankle pain following mechanical injury.  She presents for evaluation of an ankle sprain with concern of a possible fracture.  X-rays negative for any acute findings.  Patient does have some lateral and posterior bone spurs.  She is placed in an ASO brace, and given crutches to ambulate with weightbearing as tolerated.  She will follow-up with podiatry for ongoing symptom management.  Return precautions have been discussed.  Robin Cameron was evaluated in Emergency Department on 08/05/2019 for the symptoms described in the history of present illness. She was evaluated in the context of the global COVID-19 pandemic, which necessitated consideration that the patient might be at risk for infection with the SARS-CoV-2 virus that causes COVID-19. Institutional protocols and algorithms that pertain to the evaluation of patients at risk for COVID-19 are in a state of rapid change based on information released by regulatory bodies including the CDC and federal and state organizations. These policies and algorithms were followed during the patient's care in the ED. ____________________________________________  FINAL CLINICAL IMPRESSION(S) / ED DIAGNOSES  Final diagnoses:  Sprain of left ankle, unspecified ligament, initial encounter       Lissa Hoard, PA-C 08/05/19 2321    Phineas Semen, MD 08/05/19 2322

## 2019-08-05 NOTE — ED Notes (Signed)
Patient discharged to home per MD order. Patient in stable condition, and deemed medically cleared by ED provider for discharge. Discharge instructions reviewed with patient/family using "Teach Back"; verbalized understanding of medication education and administration, and information about follow-up care. Denies further concerns. ° °

## 2019-08-05 NOTE — Discharge Instructions (Addendum)
Your exam and XR are consistent with an ankle sprain. There is no evidence of a fracture. Wear the brace as needed for support. Rest, ice and elevate the foot when seated. Follow-up with Podiatry for continued symptoms.

## 2020-08-22 ENCOUNTER — Other Ambulatory Visit: Payer: Self-pay

## 2020-08-22 ENCOUNTER — Ambulatory Visit
Admission: RE | Admit: 2020-08-22 | Discharge: 2020-08-22 | Disposition: A | Discharge status: Home / Self Care | Payer: Medicaid Other | Source: Ambulatory Visit | Attending: Emergency Medicine | Admitting: Emergency Medicine

## 2020-08-22 VITALS — BP 133/85 | HR 63 | Temp 98.4°F | Resp 18

## 2020-08-22 DIAGNOSIS — H6691 Otitis media, unspecified, right ear: Secondary | ICD-10-CM | POA: Diagnosis not present

## 2020-08-22 IMAGING — CR DG ANKLE COMPLETE 3+V*L*
3 series · 3 of 3 positions shown · non-contrast
Comparison: None.

CLINICAL DATA: Injury.

EXAM:
LEFT ANKLE COMPLETE - 3+ VIEW

[ankle ap]
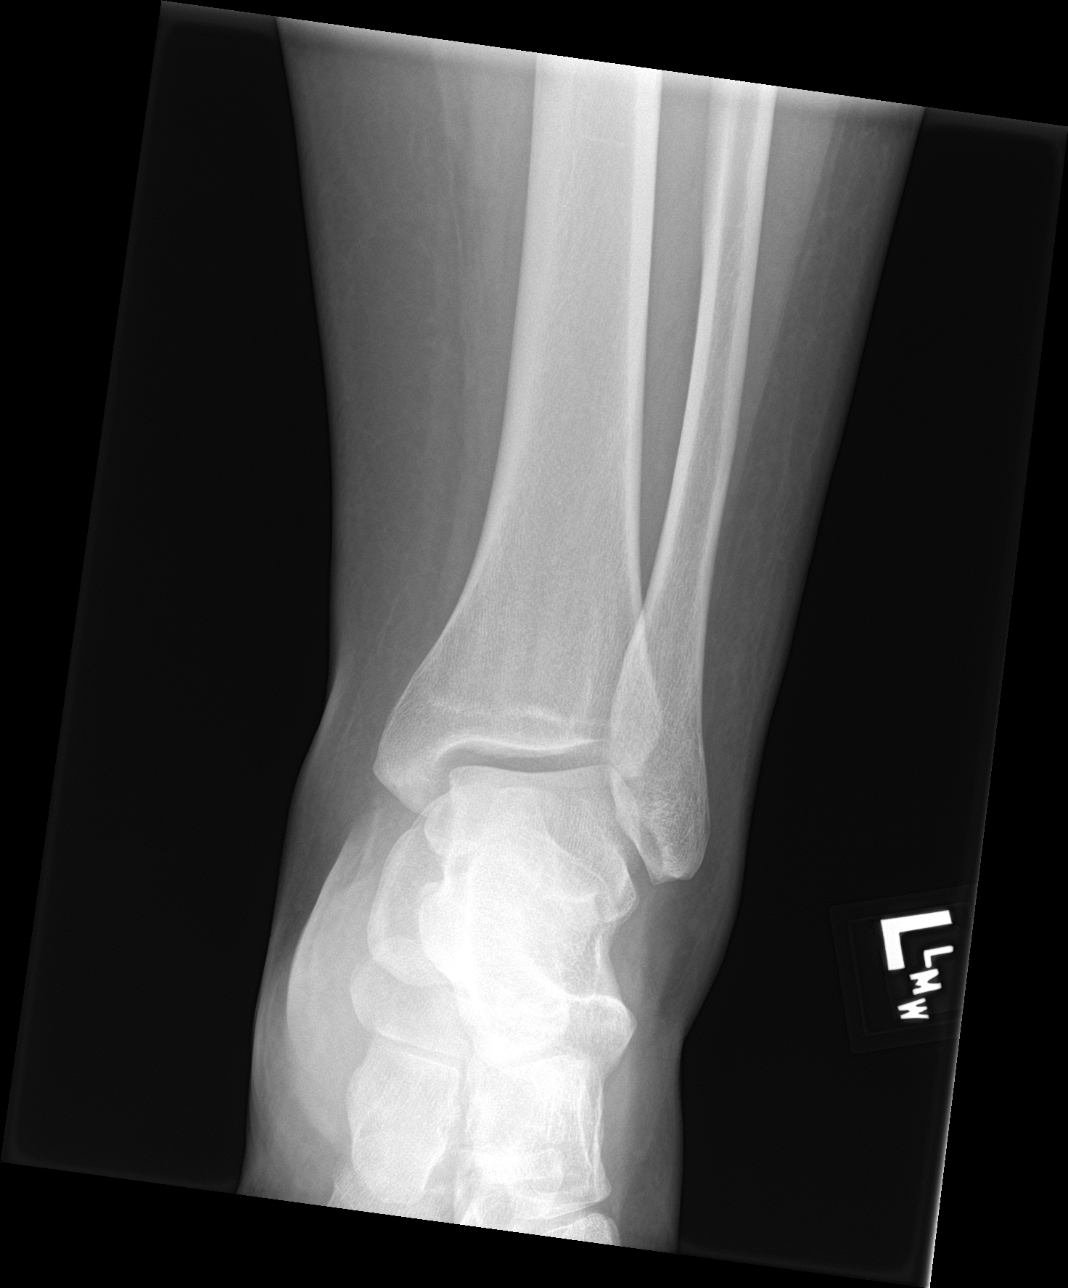

[ankle obl]
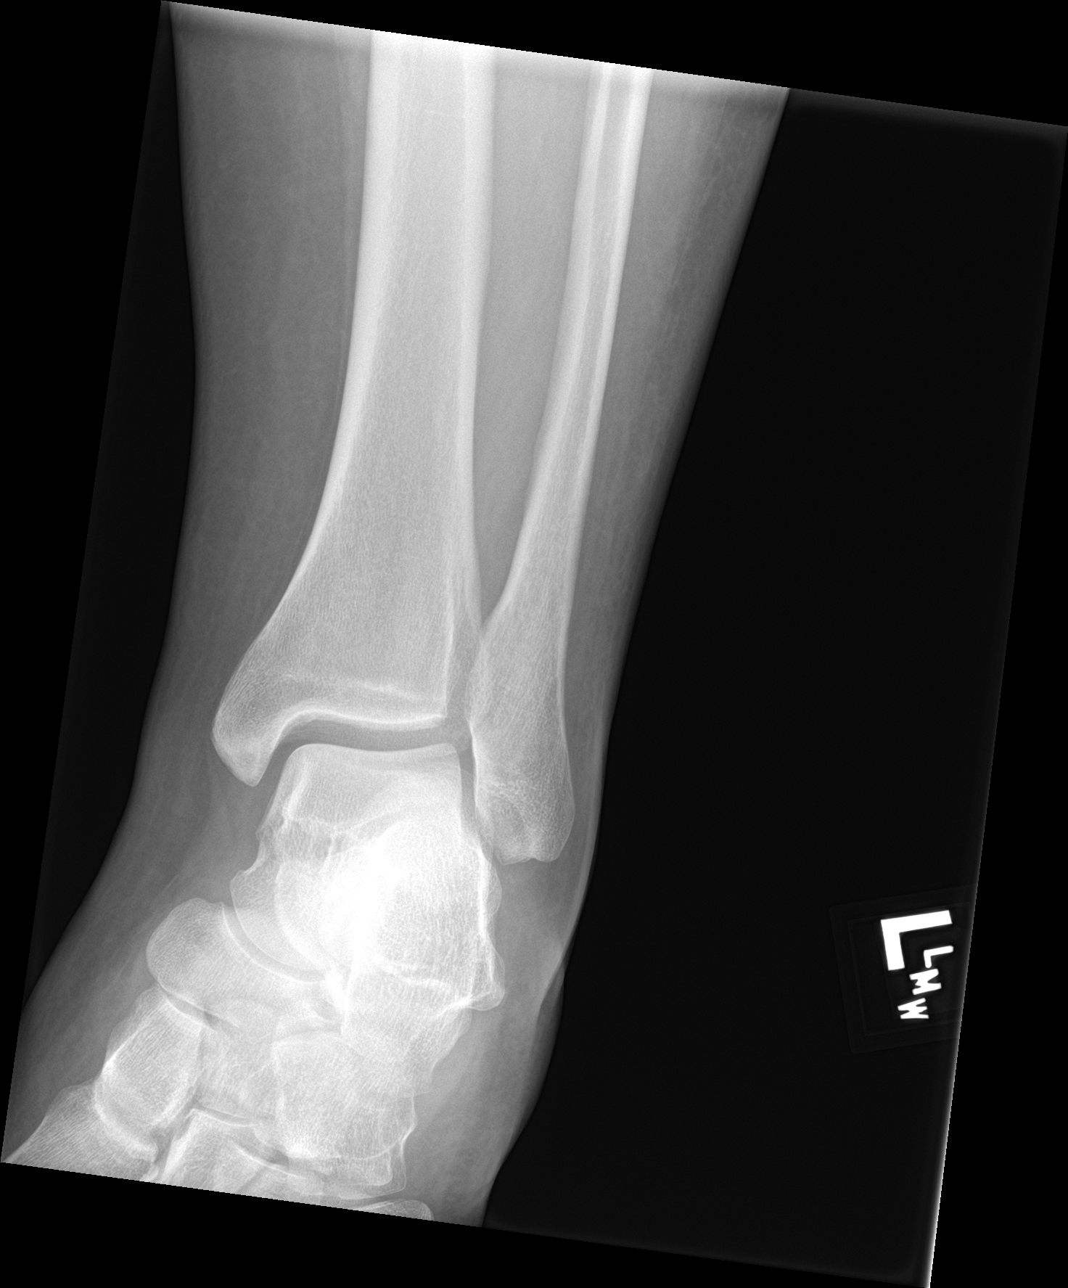

[ankle lat]
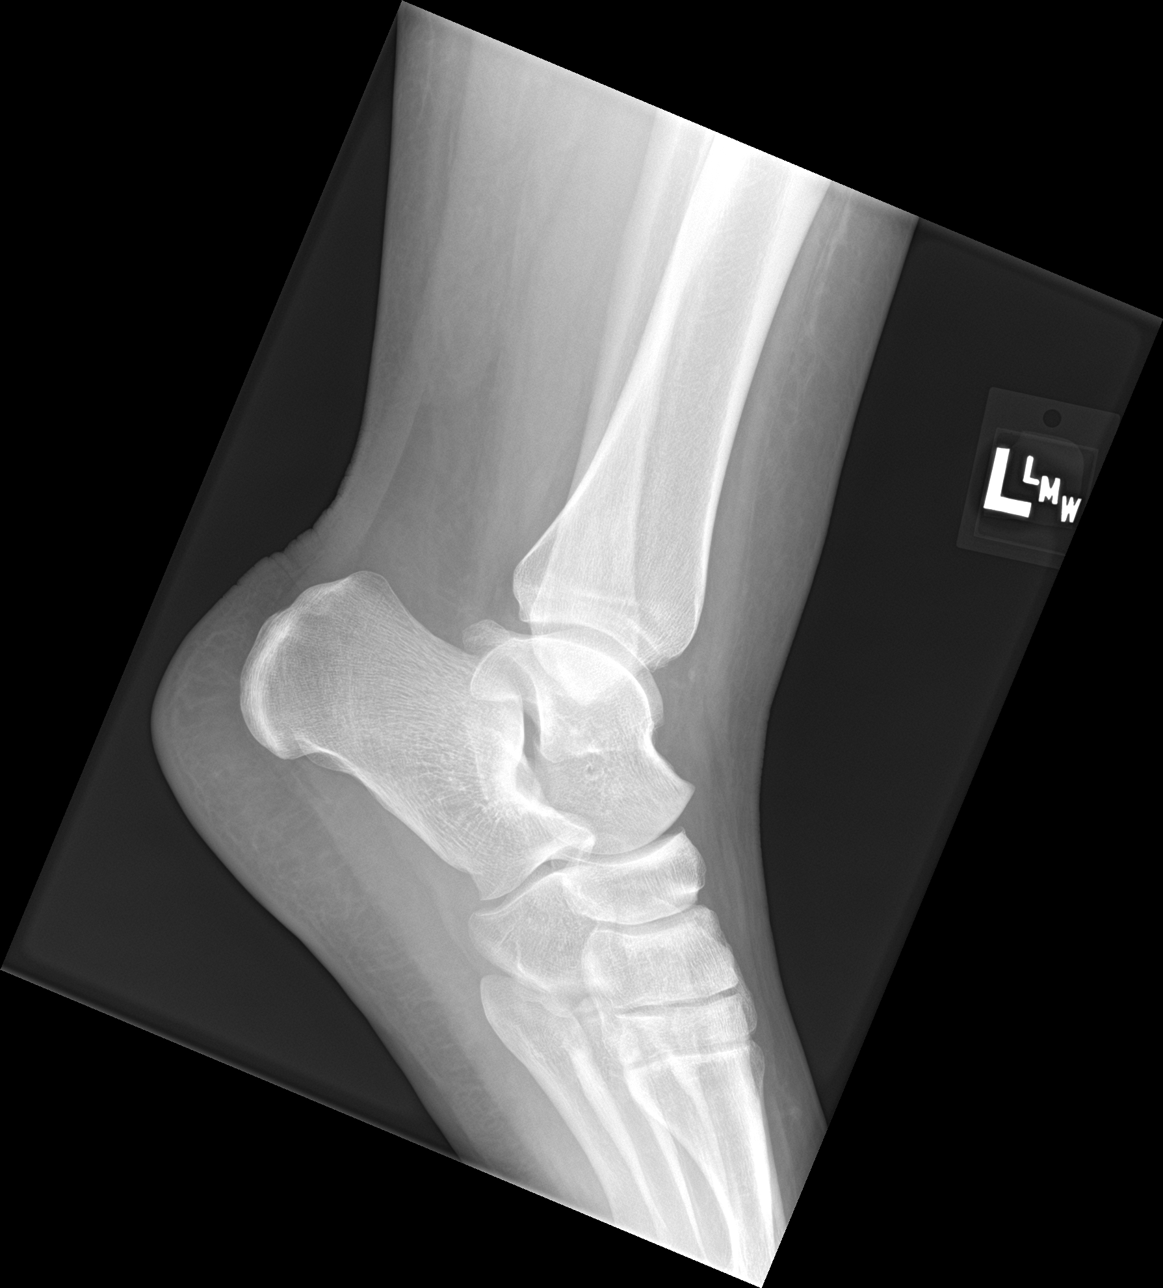

[3 of 3 positions shown; findings below may reference images not displayed]

FINDINGS: There is no evidence of fracture, dislocation, or joint effusion.
There is no evidence of arthropathy or other focal bone abnormality.
Soft tissues are unremarkable.
IMPRESSION: Negative.

## 2020-08-22 MED ORDER — AZITHROMYCIN 250 MG PO TABS: 250.0000 mg | ORAL_TABLET | Freq: Every day | ORAL | 0 refills | Status: DC

## 2020-08-22 NOTE — Discharge Instructions (Addendum)
Take the Zithromax as directed.  Follow up with your primary care provider if your symptoms are not improving.    

## 2020-08-22 NOTE — ED Provider Notes (Signed)
He did [facility]    CSN: [id number] Arrival date & time: 08/22/20  1041      History   Chief Complaint Chief Complaint  Patient presents with   Otalgia    Bilateral ears    Appointment    1115    HPI Robin Cameron is a 25 y.o. female.  Patient presents with 3-day history of bilateral ear pain, right worse than left.  Denies ear drainage, change in hearing, fever, chills, rash, sore throat, cough, or other symptoms.  Treatment at home with rubbing alcohol and peroxide.  The history is provided by the patient.   Past Medical History:  Diagnosis Date   Allergic rhinitis    History of gestational hypertension 02/03/2019   Obesity     Patient Active Problem List   Diagnosis Date Noted   Morbid obesity with body mass index (BMI) of 50.0 to 59.9 in adult [facility]) 10/08/2018   GERD (gastroesophageal reflux disease) 12/13/2012    Past Surgical History:  Procedure Laterality Date   NO PAST SURGERIES     WISDOM TOOTH EXTRACTION      OB History     Gravida  3   Para  3   Term  3   Preterm      AB      Living  3      SAB      IAB      Ectopic      Multiple  0   Live Births  3            Home Medications    Prior to Admission medications   Medication Sig Start Date End Date Taking? Authorizing Provider  azithromycin (ZITHROMAX) 250 MG tablet Take 1 tablet (250 mg total) by mouth daily. Take first 2 tablets together, then 1 every day until finished. 08/22/20  Yes Mickie Bail, NP  ferrous sulfate 325 (65 FE) MG EC tablet Take 325 mg by mouth once.    [provider]  ibuprofen (ADVIL) 800 MG tablet Take 1 tablet (800 mg total) by mouth every 8 (eight) hours as needed. 02/06/19   Hildred Laser, MD  Prenatal Vit-Fe Fumarate-FA (PRENATAL MULTIVITAMIN) TABS tablet Take 1 tablet by mouth daily at 12 noon.    [provider]  sertraline (ZOLOFT) 100 MG tablet Take 1 tablet (100 mg total) by mouth daily. 04/15/19   Hildred Laser, MD     Family History Family History  Problem Relation Age of Onset   COPD Maternal Grandmother    Hypertension Maternal Grandmother    Hyperlipidemia Maternal Grandfather    Hypertension Maternal Grandfather    COPD Maternal Grandfather    Diabetes Mother    Diabetes Father     Social History Social History   Tobacco Use   Smoking status: Never   Smokeless tobacco: Never  Vaping Use   Vaping Use: Never used  Substance Use Topics   Alcohol use: Not Currently    Comment: ocass   Drug use: No     Allergies   Shrimp [shellfish allergy], Amoxicillin, and Mushroom extract complex   Review of Systems Review of Systems  Constitutional:  Negative for chills and fever.  HENT:  Positive for ear pain. Negative for ear discharge, hearing loss and sore throat.   Respiratory:  Negative for cough and shortness of breath.   Cardiovascular:  Negative for chest pain and palpitations.  Gastrointestinal:  Negative for abdominal pain and vomiting.  Skin:  Negative for color change and rash.  All other systems reviewed and are negative.   Physical Exam Triage Vital Signs ED Triage Vitals  Enc Vitals Group     BP 08/22/20 1054 133/85     Pulse Rate 08/22/20 1054 63     Resp 08/22/20 1054 18     Temp 08/22/20 1054 98.4 F (36.9 C)     Temp Source 08/22/20 1054 Oral     SpO2 08/22/20 1054 97 %     Weight --      Height --      Head Circumference --      Peak Flow --      Pain Score 08/22/20 1107 6     Pain Loc --      Pain Edu? --      Excl. in GC? --    No data found.  Updated Vital Signs BP 133/85 (BP Location: Left Arm)   Pulse 63   Temp 98.4 F (36.9 C) (Oral)   Resp 18   LMP 07/29/2020 (Exact Date)   SpO2 97%   Visual Acuity Right Eye Distance:   Left Eye Distance:   Bilateral Distance:    Right Eye Near:   Left Eye Near:    Bilateral Near:     Physical Exam Vitals and nursing note reviewed.  Constitutional:      General: She is not in acute distress.     Appearance: She is well-developed. She is obese. She is not ill-appearing.  HENT:     Head: Normocephalic and atraumatic.     Right Ear: Ear canal normal. Tympanic membrane is erythematous.     Left Ear: Tympanic membrane and ear canal normal.     Nose: Nose normal.     Mouth/Throat:     Mouth: Mucous membranes are moist.     Pharynx: Oropharynx is clear.  Eyes:     Conjunctiva/sclera: Conjunctivae normal.  Cardiovascular:     Rate and Rhythm: Normal rate and regular rhythm.     Heart sounds: Normal heart sounds.  Pulmonary:     Effort: Pulmonary effort is normal. No respiratory distress.     Breath sounds: Normal breath sounds.  Abdominal:     Palpations: Abdomen is soft.     Tenderness: There is no abdominal tenderness.  Musculoskeletal:     Cervical back: Neck supple.  Skin:    General: Skin is warm and dry.  Neurological:     General: No focal deficit present.     Mental Status: She is alert and oriented to person, place, and time.     Gait: Gait normal.  Psychiatric:        Mood and Affect: Mood normal.        Behavior: Behavior normal.     UC Treatments / Results  Labs (all labs ordered are listed, but only abnormal results are displayed) Labs Reviewed - No data to display  EKG   Radiology No results found.  Procedures Procedures (including critical care time)  Medications Ordered in UC Medications - No data to display  Initial Impression / Assessment and Plan / UC Course  I have reviewed the triage vital signs and the nursing notes.  Pertinent labs & imaging results that were available during my care of the patient were reviewed by me and considered in my medical decision making (see chart for details).   Right otitis media.  Treating with Zithromax.  Tylenol or ibuprofen as needed.  Instructed  patient to follow-up with her PCP if her symptoms are not improving.  She agrees to plan of care.   Final Clinical Impressions(s) / UC Diagnoses   Final  diagnoses:  Right otitis media, unspecified otitis media type     Discharge Instructions      Take the Zithromax as directed.    Follow up with your primary care provider if your symptoms are not improving.         ED Prescriptions     Medication Sig Dispense Auth. Provider   azithromycin (ZITHROMAX) 250 MG tablet Take 1 tablet (250 mg total) by mouth daily. Take first 2 tablets together, then 1 every day until finished. 6 tablet Mickie Bail, NP      PDMP not reviewed this encounter.   Mickie Bail, NP 08/22/20 1125

## 2020-08-22 NOTE — ED Triage Notes (Signed)
Patient presents to Urgent Care with complaints of bilateral ear pain since 3 days. Pt states she went swimming and unsure if related to swimmers ear. Treating ear discomfort with alcohol and peroxide.    Denies fever.

## 2022-12-31 ENCOUNTER — Ambulatory Visit (INDEPENDENT_AMBULATORY_CARE_PROVIDER_SITE_OTHER): Payer: Medicaid Other | Admitting: General Practice

## 2022-12-31 ENCOUNTER — Encounter: Payer: Self-pay | Admitting: General Practice

## 2022-12-31 VITALS — BP 122/84 | HR 97 | Temp 98.1°F | Ht 66.0 in | Wt 324.0 lb

## 2022-12-31 DIAGNOSIS — Z0000 Encounter for general adult medical examination without abnormal findings: Secondary | ICD-10-CM | POA: Insufficient documentation

## 2022-12-31 DIAGNOSIS — Z6841 Body Mass Index (BMI) 40.0 and over, adult: Secondary | ICD-10-CM

## 2022-12-31 DIAGNOSIS — Z833 Family history of diabetes mellitus: Secondary | ICD-10-CM | POA: Diagnosis not present

## 2022-12-31 NOTE — Patient Instructions (Signed)
Stop by the lab prior to leaving today. I will notify you of your results once received.   It was a pleasure meeting you!  

## 2022-12-31 NOTE — Assessment & Plan Note (Signed)
Discussed the importance of a healthy diet and exercise. She just started a new exercise regimen.

## 2022-12-31 NOTE — Assessment & Plan Note (Signed)
Stable

## 2022-12-31 NOTE — Assessment & Plan Note (Signed)
Immunizations UTD. Pap smear due. She will schedule appt with her GYN.  Discussed the importance of a healthy diet and regular exercise in order for weight loss, and to reduce the risk of further co-morbidity.  Exam stable. Labs pending.  Follow up in 1 year for repeat physical.

## 2022-12-31 NOTE — Progress Notes (Signed)
New Patient Office Visit  Subjective    Patient ID: Robin Cameron, female    DOB: 09/27/1995  Age: 27 y.o. MRN: [id number]  CC:  Chief Complaint  Patient presents with   New Patient (Initial Visit)    HPI Robin Cameron is a 27 y.o. female presents to establish care.  PCP- [facility] Last physical early 2023. Labs early 2023.  Immunizations: -Tetanus: Completed in 2020. -Influenza: completed   Diet: Fair diet. She has been making better choices in her diet lately.  Exercise: Started [employer] hoop.   Eye exam: completed several years ago. Dental exam: Completes semi-annually    Pap Smear: 2019 - due; Dr. Valentino Saxon in [facility]      Outpatient Encounter Medications as of 12/31/2022  Medication Sig   Multiple Vitamin (MULTIVITAMIN) capsule Take 1 capsule by mouth daily.   [DISCONTINUED] azithromycin (ZITHROMAX) 250 MG tablet Take 1 tablet (250 mg total) by mouth daily. Take first 2 tablets together, then 1 every day until finished.   [DISCONTINUED] ferrous sulfate 325 (65 FE) MG EC tablet Take 325 mg by mouth once.   [DISCONTINUED] ibuprofen (ADVIL) 800 MG tablet Take 1 tablet (800 mg total) by mouth every 8 (eight) hours as needed.   [DISCONTINUED] Prenatal Vit-Fe Fumarate-FA (PRENATAL MULTIVITAMIN) TABS tablet Take 1 tablet by mouth daily at 12 noon.   [DISCONTINUED] sertraline (ZOLOFT) 100 MG tablet Take 1 tablet (100 mg total) by mouth daily.   No facility-administered encounter medications on file as of 12/31/2022.    Past Medical History:  Diagnosis Date   Allergic rhinitis    GERD (gastroesophageal reflux disease) 12/13/2012   History of gestational hypertension 02/03/2019   Obesity     Past Surgical History:  Procedure Laterality Date   NO PAST SURGERIES     WISDOM TOOTH EXTRACTION      Family History  Problem Relation Age of Onset   Hypertension Mother    Hyperlipidemia Mother    Diabetes Mother    Diabetes Father    Depression Maternal  Grandmother    Asthma Maternal Grandmother    Arthritis Maternal Grandmother    COPD Maternal Grandmother    Hypertension Maternal Grandmother    Hypertension Paternal Grandfather    Hyperlipidemia Paternal Grandfather    Heart disease Paternal Grandfather    Diabetes Paternal Grandfather     Social History   Socioeconomic History   Marital status: Significant Other    Spouse name: Karl Luke   Number of children: Not on file   Years of education: Not on file   Highest education level: Not on file  Occupational History   Occupation: Homemaker  Tobacco Use   Smoking status: Never   Smokeless tobacco: Never  Vaping Use   Vaping status: Never Used  Substance and Sexual Activity   Alcohol use: Not Currently    Comment: ocassional   Drug use: No   Sexual activity: Yes    Birth control/protection: Condom    Comment: only one partner  Other Topics Concern   Not on file  Social History Narrative   Not on file   Social Drivers of Health   Financial Resource Strain: Not on file  Food Insecurity: Not on file  Transportation Needs: Not on file  Physical Activity: Not on file  Stress: Not on file  Social Connections: Not on file  Intimate Partner Violence: Not on file    Review of Systems  Constitutional:  Negative for chills, fever, malaise/fatigue  and weight loss.  HENT:  Negative for congestion, ear discharge, ear pain, hearing loss, nosebleeds, sinus pain, sore throat and tinnitus.   Eyes:  Negative for blurred vision, double vision, pain, discharge and redness.  Respiratory:  Negative for cough, shortness of breath, wheezing and stridor.   Cardiovascular:  Negative for chest pain, palpitations and leg swelling.  Gastrointestinal:  Negative for abdominal pain, constipation, diarrhea, heartburn, nausea and vomiting.  Genitourinary:  Negative for dysuria, frequency and urgency.  Musculoskeletal:  Negative for myalgias.  Skin:  Negative for rash.  Neurological:   Negative for dizziness, tingling, seizures, weakness and headaches.  Psychiatric/Behavioral:  Negative for depression, substance abuse and suicidal ideas. The patient is not nervous/anxious.         Objective    BP 122/84 (BP Location: Left Arm, Patient Position: Sitting, Cuff Size: Large)   Pulse 97   Temp 98.1 F (36.7 C) (Oral)   Ht 5\' 6"  (1.676 m)   Wt (!) 324 lb (147 kg)   SpO2 99%   BMI 52.29 kg/m   Physical Exam Vitals and nursing note reviewed.  Constitutional:      Appearance: Normal appearance.  HENT:     Head: Normocephalic and atraumatic.     Right Ear: Tympanic membrane, ear canal and external ear normal.     Left Ear: Tympanic membrane, ear canal and external ear normal.     Nose: Nose normal.     Mouth/Throat:     Mouth: Mucous membranes are moist.     Pharynx: Oropharynx is clear.  Eyes:     Conjunctiva/sclera: Conjunctivae normal.     Pupils: Pupils are equal, round, and reactive to light.  Cardiovascular:     Rate and Rhythm: Normal rate and regular rhythm.     Pulses: Normal pulses.     Heart sounds: Normal heart sounds.  Pulmonary:     Effort: Pulmonary effort is normal.     Breath sounds: Normal breath sounds.  Abdominal:     General: Abdomen is flat. Bowel sounds are normal.     Palpations: Abdomen is soft.  Musculoskeletal:        General: Normal range of motion.     Cervical back: Normal range of motion.  Skin:    General: Skin is warm and dry.     Capillary Refill: Capillary refill takes less than 2 seconds.  Neurological:     General: No focal deficit present.     Mental Status: She is alert and oriented to person, place, and time. Mental status is at baseline.  Psychiatric:        Mood and Affect: Mood normal.        Behavior: Behavior normal.        Thought Content: Thought content normal.        Judgment: Judgment normal.         Assessment & Plan:  Encounter for annual general medical examination without abnormal findings  in adult Assessment & Plan: Immunizations UTD. Pap smear due. She will schedule appt with her GYN.  Discussed the importance of a healthy diet and regular exercise in order for weight loss, and to reduce the risk of further co-morbidity.  Exam stable. Labs pending.  Follow up in 1 year for repeat physical.   Orders: -     CBC -     Comprehensive metabolic panel  Family history of type 2 diabetes mellitus -     Hemoglobin A1c  Morbid obesity  with body mass index (BMI) of 50.0 to 59.9 in adult [facility]) Assessment & Plan: Discussed the importance of a healthy diet and exercise. She just started a new exercise regimen.       Return in about 1 year (around 12/31/2023) for physical.   Modesto Charon, NP

## 2023-01-01 LAB — CBC
HCT: 39.8 % (ref 36.0–46.0)
Hemoglobin: 13.3 g/dL (ref 12.0–15.0)
MCHC: 33.5 g/dL (ref 30.0–36.0)
MCV: 81.6 fL (ref 78.0–100.0)
Platelets: 326 10*3/uL (ref 150.0–400.0)
RBC: 4.88 Mil/uL (ref 3.87–5.11)
RDW: 14.3 % (ref 11.5–15.5)
WBC: 8.9 10*3/uL (ref 4.0–10.5)

## 2023-01-01 LAB — COMPREHENSIVE METABOLIC PANEL
ALT: 19 U/L (ref 0–35)
AST: 16 U/L (ref 0–37)
Albumin: 4.4 g/dL (ref 3.5–5.2)
Alkaline Phosphatase: 73 U/L (ref 39–117)
BUN: 21 mg/dL (ref 6–23)
CO2: 25 meq/L (ref 19–32)
Calcium: 9 mg/dL (ref 8.4–10.5)
Chloride: 104 meq/L (ref 96–112)
Creatinine, Ser: 0.71 mg/dL (ref 0.40–1.20)
GFR: 116.49 mL/min (ref >60.00)
Glucose, Bld: 84 mg/dL (ref 70–99)
Potassium: 4.4 meq/L (ref 3.5–5.1)
Sodium: 137 meq/L (ref 135–145)
Total Bilirubin: 0.3 mg/dL (ref 0.2–1.2)
Total Protein: 7.6 g/dL (ref 6.0–8.3)

## 2023-01-01 LAB — HEMOGLOBIN A1C: Hgb A1c MFr Bld: 5.7 % (ref 4.6–6.5)

## 2023-01-01 NOTE — Progress Notes (Signed)
Hi Annleigh,   Blood count, kidneys and liver functions all are within normal limits. A1C did show that you are prediabetic.  We can keep an eye on this. It is important that you improve your diet. Please limit carbohydrates in the form of white bread, rice, pasta, sweets, fast food, fried food, sugary drinks, etc. Increase your consumption of fresh fruits and vegetables, whole grains, lean protein.  Ensure you are consuming 64 ounces of water daily.  Thanks and Take care.   -Modesto Charon, DNP, AGNP-C 01/01/2023 12:57 PM

## 2023-01-10 ENCOUNTER — Encounter: Payer: Self-pay | Admitting: *Deleted

## 2023-01-10 DIAGNOSIS — S61411A Laceration without foreign body of right hand, initial encounter: Secondary | ICD-10-CM | POA: Insufficient documentation

## 2023-01-10 DIAGNOSIS — W275XXA Contact with paper-cutter, initial encounter: Secondary | ICD-10-CM | POA: Diagnosis not present

## 2023-01-10 DIAGNOSIS — S6991XA Unspecified injury of right wrist, hand and finger(s), initial encounter: Secondary | ICD-10-CM | POA: Diagnosis present

## 2023-01-10 NOTE — ED Triage Notes (Signed)
Pt has laceration to right hand.  Pt cut self while opening a toy package.  Bleeding controlled   pt alert.

## 2023-01-11 ENCOUNTER — Emergency Department
Admission: EM | Admit: 2023-01-11 | Discharge: 2023-01-11 | Disposition: A | Discharge status: Home / Self Care | Payer: Medicaid Other | Attending: Emergency Medicine | Admitting: Emergency Medicine

## 2023-01-11 DIAGNOSIS — S61411A Laceration without foreign body of right hand, initial encounter: Secondary | ICD-10-CM

## 2023-01-11 MED ORDER — LIDOCAINE HCL (PF) 1 % IJ SOLN
5.0000 mL | Freq: Once | INTRAMUSCULAR | Status: AC
  Administered 2023-01-11: 5 mL
  Filled 2023-01-11: qty 5

## 2023-01-11 NOTE — Discharge Instructions (Signed)
You have been seen in the Emergency Department (ED) today for a laceration (cut).  Please keep the cut clean but do not submerge it in the water.  It has been repaired with staples or sutures that will need to be removed in about 7 days. Please follow up with your doctor, an urgent care, or return to the ED for suture removal.   ° °Please take Tylenol (acetaminophen) or Motrin (ibuprofen) as needed for discomfort as written on the box.  ° °Please follow up with your doctor as soon as possible regarding today's emergent visit.  ° °Return to the ED or call your doctor if you notice any signs of infection such as fever, increased pain, increased redness, pus, or other symptoms that concern you. °

## 2023-01-11 NOTE — ED Provider Notes (Signed)
[facility] Provider Note    Event Date/Time   First MD Initiated Contact with Patient 01/11/23 0335     (approximate)   History   Laceration   HPI Robin Cameron is a 27 y.o. female who presents for evaluation of a laceration to her right hand.  The laceration is on the hand just at the base of the thumb on the dorsal side.  Her toddler somehow got hold of a box cutter and she grabbed for it before the child could injure herself and the patient got cut.  Bleeding is well-controlled.  She cleaned it and it has been soaking in a iodine/water solution in the emergency department for an extended period of time.  No numbness nor tingling.  She is up-to-date on her tetanus vaccination.  She is left-hand dominant.     Physical Exam   Triage Vital Signs: ED Triage Vitals  Encounter Vitals Group     BP 01/10/23 2127 (!) 144/92     Systolic BP Percentile --      Diastolic BP Percentile --      Pulse Rate 01/10/23 2127 97     Resp 01/10/23 2127 18     Temp 01/10/23 2127 98.4 F (36.9 C)     Temp Source 01/10/23 2127 Oral     SpO2 01/10/23 2127 98 %     Weight 01/10/23 2121 (!) 147 kg (324 lb)     Height 01/10/23 2121 1.676 m (5\' 6" )     Head Circumference --      Peak Flow --      Pain Score 01/10/23 2121 2     Pain Loc --      Pain Education --      Exclude from Growth Chart --     Most recent vital signs: Vitals:   01/10/23 2127 01/11/23 0419  BP: (!) 144/92 (!) 131/92  Pulse: 97 84  Resp: 18 18  Temp: 98.4 F (36.9 C) 98.4 F (36.9 C)  SpO2: 98% 96%    General: Awake, no distress.  CV:  Good peripheral perfusion.  Resp:  Normal effort. Speaking easily and comfortably, no accessory muscle usage nor intercostal retractions.   Abd:  No distention.  Other:  1.6 cm laceration to the dorsum of the right hand at the base of the thumb.  See procedure note for details.   ED Results / Procedures / Treatments   Labs (all labs ordered are listed,  but only abnormal results are displayed) Labs Reviewed - No data to display     PROCEDURES:  Critical Care performed: No  .Laceration Repair  Date/Time: 01/11/2023 4:00 AM  Performed by: Loleta Rose, MD Authorized by: Loleta Rose, MD   Consent:    Consent obtained:  Verbal   Consent given by:  Patient   Risks discussed:  Infection, need for additional repair, poor cosmetic result, pain and poor wound healing Universal protocol:    Patient identity confirmed:  Verbally with patient Anesthesia:    Anesthesia method:  Local infiltration   Local anesthetic:  Lidocaine 1% w/o epi Laceration details:    Location:  Hand   Hand location:  R hand, dorsum   Length (cm):  1.6 Exploration:    Hemostasis achieved with:  Direct pressure   Contaminated: no   Treatment:    Area cleansed with:  Povidone-iodine and saline   Amount of cleaning:  Extensive Skin repair:    Repair method:  Sutures  Suture size:  5-0   Wound skin closure material used: Ethilon.   Suture technique:  Simple interrupted   Number of sutures:  3 Approximation:    Approximation:  Close Repair type:    Repair type:  Simple Post-procedure details:    Dressing:  Adhesive bandage   Procedure completion:  Tolerated well, no immediate complications     IMPRESSION / MDM / ASSESSMENT AND PLAN / ED COURSE  I reviewed the triage vital signs and the nursing notes.                              Differential diagnosis includes, but is not limited to, laceration, neurovascular injury, tendon damage.  Patient's presentation is most consistent with acute, uncomplicated illness.  Interventions/Medications given:  Medications  lidocaine (PF) (XYLOCAINE) 1 % injection 5 mL (5 mLs Other Given 01/11/23 0345)    (Note:  hospital course my include additional interventions and/or labs/studies not listed above.)   Simple suture repair, gave my usual and customary follow-up recommendations and return precautions.   Wound was clean, well irrigated, and patient has no concerning comorbidities.  No need for antibiotics at this time.       FINAL CLINICAL IMPRESSION(S) / ED DIAGNOSES   Final diagnoses:  Laceration of right hand without foreign body, initial encounter     Rx / DC Orders   ED Discharge Orders     None        Note:  This document was prepared using Dragon voice recognition software and may include unintentional dictation errors.   Loleta Rose, MD 01/11/23 [phone]

## 2023-01-11 NOTE — ED Notes (Signed)
Patient given discharge instructions including importance of stitch removal and wound care. Patient stable and ambulatory with steady even gait on dispo.

## 2023-01-11 NOTE — ED Notes (Signed)
Pt bandage removed at this time. No bleeding noted. Pt instructed to soak hand in saline and iodine

## 2023-01-17 ENCOUNTER — Inpatient Hospital Stay: Payer: Medicaid Other | Admitting: Family Medicine

## 2023-01-17 ENCOUNTER — Ambulatory Visit: Payer: Medicaid Other | Admitting: Family

## 2023-01-17 VITALS — BP 130/76 | HR 72 | Temp 98.6°F | Ht 66.0 in | Wt 324.0 lb

## 2023-01-17 DIAGNOSIS — S61011A Laceration without foreign body of right thumb without damage to nail, initial encounter: Secondary | ICD-10-CM | POA: Insufficient documentation

## 2023-01-17 MED ORDER — DOXYCYCLINE HYCLATE 100 MG PO TABS: 100.0000 mg | ORAL_TABLET | Freq: Two times a day (BID) | ORAL | 0 refills | Status: DC

## 2023-01-17 NOTE — Assessment & Plan Note (Signed)
 RX doxycycline  100 mg po bid x 10 days Hesitant for cephalosporin as allergy to pcn was pretty extensive per pt (hives all over body) Will pend removal of sutures for small midline delayed wound healing.  Mild erythema, so treating with antibiotic.  Pt advised to:  Please monitor site for worsening signs/symptoms of infection to include: increasing redness, increasing tenderness, increase in size, and or pustulant drainage from site. If this is to occur please let me know immediately.  Pt to f/u on Monday 1/6 for evaluation for suture removal

## 2023-01-17 NOTE — Progress Notes (Signed)
 Established Patient Office Visit  Subjective:   Patient ID: Robin Cameron, female    DOB: Aug 15, 1995  Age: 28 y.o. MRN: [id number]  CC:  Chief Complaint  Patient presents with   Suture / Staple Removal    Placed 01/10/23    HPI: Robin Cameron is a 28 y.o. female presenting on 01/17/2023 for Suture / Staple Removal (Placed 01/10/23)  12/27 went to ER for laceration to hand at the base of the thumb from a knife blade. 3 sutures were placed in the dorsum of the right hand.   It has been healing well without redness, however itches a lot. Wears bandage when out and about. She has been using a small amount of antibacterial soap and washing lightly with water a few times a week. No fever. No redness. No discharge.        ROS: Negative unless specifically indicated above in HPI.   Relevant past medical history reviewed and updated as indicated.   Allergies and medications reviewed and updated.   Current Outpatient Medications:    Multiple Vitamin (MULTIVITAMIN) capsule, Take 1 capsule by mouth daily., Disp: , Rfl:    doxycycline  (VIBRA -TABS) 100 MG tablet, Take 1 tablet (100 mg total) by mouth 2 (two) times daily for 7 days., Disp: 14 tablet, Rfl: 0  Allergies  Allergen Reactions   Shrimp [Shellfish Allergy] Rash   Amoxicillin  Hives    States was pretty significant with full body hives   Mushroom Extract Complex (Do Not Select) Rash    Objective:   BP 130/76   Pulse 72   Temp 98.6 F (37 C) (Oral)   Ht 5' 6 (1.676 m)   Wt (!) 324 lb (147 kg)   LMP 01/05/2023   SpO2 99%   Breastfeeding No   BMI 52.29 kg/m    Physical Exam Constitutional:      General: She is not in acute distress.    Appearance: Normal appearance. She is normal weight. She is not ill-appearing, toxic-appearing or diaphoretic.  HENT:     Head: Normocephalic.  Cardiovascular:     Rate and Rhythm: Normal rate.  Pulmonary:     Effort: Pulmonary effort is normal.  Musculoskeletal:        General:  Normal range of motion.  Skin:    Comments: Three sutures in place on on dorsal right thumb. With mild erythema. Midline slight delayed wound closure. No drainage. Dried blood along incision site. Slight tenderness upon palpation. No change in ROM.  Neurological:     General: No focal deficit present.     Mental Status: She is alert and oriented to person, place, and time. Mental status is at baseline.  Psychiatric:        Mood and Affect: Mood normal.        Behavior: Behavior normal.        Thought Content: Thought content normal.        Judgment: Judgment normal.      Assessment & Plan:  Laceration of right thumb without foreign body without damage to nail, initial encounter Assessment & Plan: RX doxycycline  100 mg po bid x 10 days Hesitant for cephalosporin as allergy to pcn was pretty extensive per pt (hives all over body) Will pend removal of sutures for small midline delayed wound healing.  Mild erythema, so treating with antibiotic.  Pt advised to:  Please monitor site for worsening signs/symptoms of infection to include: increasing redness, increasing tenderness, increase in size, and or pustulant  drainage from site. If this is to occur please let me know immediately.  Pt to f/u on Monday 1/6 for evaluation for suture removal   Orders: -     Doxycycline  Hyclate; Take 1 tablet (100 mg total) by mouth 2 (two) times daily for 7 days.  Dispense: 14 tablet; Refill: 0     Follow up plan: Return in about 4 days (around 01/21/2023).  Robin Patrick, FNP

## 2023-01-21 ENCOUNTER — Ambulatory Visit (INDEPENDENT_AMBULATORY_CARE_PROVIDER_SITE_OTHER): Payer: Medicaid Other | Admitting: Family

## 2023-01-21 VITALS — BP 132/84 | HR 97 | Temp 97.6°F | Ht 66.0 in | Wt 324.0 lb

## 2023-01-21 DIAGNOSIS — S61011A Laceration without foreign body of right thumb without damage to nail, initial encounter: Secondary | ICD-10-CM | POA: Diagnosis not present

## 2023-01-21 DIAGNOSIS — R12 Heartburn: Secondary | ICD-10-CM | POA: Diagnosis not present

## 2023-01-21 MED ORDER — SULFAMETHOXAZOLE-TRIMETHOPRIM 800-160 MG PO TABS: 1.0000 | ORAL_TABLET | Freq: Two times a day (BID) | ORAL | 0 refills | Status: AC

## 2023-01-21 MED ORDER — OMEPRAZOLE 20 MG PO CPDR: 20.0000 mg | DELAYED_RELEASE_CAPSULE | Freq: Every day | ORAL | 0 refills | Status: AC

## 2023-01-21 NOTE — Assessment & Plan Note (Signed)
 Trial omeprazole x 2 weeks  Rx 20 mg sent to pharmacy

## 2023-01-21 NOTE — Assessment & Plan Note (Signed)
 Patient presents for suture removal. The wound is well healed however superficial dermis not approximated completely, without signs of infection. Iodine applied to site, the sutures are removed three in its entirety. Dressed with bandaid. Wound care and activity instructions given. Return prn. Four steri strips applied across incision.   Pt advised to:  Please monitor site for worsening signs/symptoms of infection to include: increasing redness, increasing tenderness, increase in size, and or pustulant drainage from site. If this is to occur please let me know immediately.

## 2023-01-21 NOTE — Progress Notes (Signed)
 Established Patient Office Visit  Subjective:      CC:  Chief Complaint  Patient presents with   Follow-up    HPI: Robin Cameron is a 28 y.o. female presenting on 01/21/2023 for Follow-up . Right hand wound, base of thumb. Healing well, no signs of infection per patient. No discharge. Three sutures still in place with full rom of hand.  On day four of doxycycline .   Having some hiccups and irritation to mid stomach after taking a doxycycline  pill a few days ago states feels like it didn't go down all of the way. She is not throwing up and is only slightly nauseous. She did take one pill doxycycline  this am but did not take it last night.       Social history:  Relevant past medical, surgical, family and social history reviewed and updated as indicated. Interim medical history since our last visit reviewed.  Allergies and medications reviewed and updated.  DATA REVIEWED: CHART IN EPIC     ROS: Negative unless specifically indicated above in HPI.    Current Outpatient Medications:    Multiple Vitamin (MULTIVITAMIN) capsule, Take 1 capsule by mouth daily., Disp: , Rfl:    omeprazole  (PRILOSEC) 20 MG capsule, Take 1 capsule (20 mg total) by mouth daily., Disp: 30 capsule, Rfl: 0   sulfamethoxazole -trimethoprim  (BACTRIM  DS) 800-160 MG tablet, Take 1 tablet by mouth 2 (two) times daily for 7 days., Disp: 14 tablet, Rfl: 0      Objective:    BP 132/84 (BP Location: Left Arm, Patient Position: Sitting, Cuff Size: Large)   Pulse 97   Temp 97.6 F (36.4 C) (Temporal)   Ht 5' 6 (1.676 m)   Wt (!) 324 lb (147 kg)   LMP 01/05/2023   SpO2 99%   BMI 52.29 kg/m   Wt Readings from Last 3 Encounters:  01/21/23 (!) 324 lb (147 kg)  01/17/23 (!) 324 lb (147 kg)  01/10/23 (!) 324 lb (147 kg)    Physical Exam Constitutional:      General: She is not in acute distress.    Appearance: Normal appearance. She is normal weight. She is not ill-appearing, toxic-appearing or  diaphoretic.  HENT:     Head: Normocephalic.  Cardiovascular:     Rate and Rhythm: Normal rate.  Pulmonary:     Effort: Pulmonary effort is normal.  Musculoskeletal:        General: Normal range of motion.  Skin:    Findings: Laceration (healing base of right thumb. no erythema no discharge.) present.  Neurological:     General: No focal deficit present.     Mental Status: She is alert and oriented to person, place, and time. Mental status is at baseline.  Psychiatric:        Mood and Affect: Mood normal.        Behavior: Behavior normal.        Thought Content: Thought content normal.        Judgment: Judgment normal.    Deep layer dermis approximated, superficial layer not approximated to the right 2/3 portion of the laceration. No discharge. No erythema.           Assessment & Plan:  Heart burn Assessment & Plan: Trial omeprazole  x 2 weeks  Rx 20 mg sent to pharmacy   Orders: -     Omeprazole ; Take 1 capsule (20 mg total) by mouth daily.  Dispense: 30 capsule; Refill: 0  Laceration of right thumb without  foreign body without damage to nail, initial encounter Assessment & Plan: Patient presents for suture removal. The wound is well healed however superficial dermis not approximated completely, without signs of infection. Iodine applied to site, the sutures are removed three in its entirety. Dressed with bandaid. Wound care and activity instructions given. Return prn. Four steri strips applied across incision.   Pt advised to:  Please monitor site for worsening signs/symptoms of infection to include: increasing redness, increasing tenderness, increase in size, and or pustulant drainage from site. If this is to occur please let me know immediately.    Orders: -     Sulfamethoxazole -Trimethoprim ; Take 1 tablet by mouth 2 (two) times daily for 7 days.  Dispense: 14 tablet; Refill: 0     Return for follow up wound this friday 01/25/23.  [name], MSN, APRN,  FNP-C [facility]

## 2023-01-28 ENCOUNTER — Encounter: Payer: Self-pay | Admitting: *Deleted

## 2023-01-28 ENCOUNTER — Ambulatory Visit (INDEPENDENT_AMBULATORY_CARE_PROVIDER_SITE_OTHER): Payer: Medicaid Other | Admitting: General Practice

## 2023-01-28 ENCOUNTER — Encounter: Payer: Self-pay | Admitting: General Practice

## 2023-01-28 VITALS — BP 134/86 | HR 75 | Temp 97.8°F | Ht 66.0 in | Wt 327.0 lb

## 2023-01-28 DIAGNOSIS — F419 Anxiety disorder, unspecified: Secondary | ICD-10-CM

## 2023-01-28 DIAGNOSIS — Z6841 Body Mass Index (BMI) 40.0 and over, adult: Secondary | ICD-10-CM | POA: Diagnosis not present

## 2023-01-28 DIAGNOSIS — R12 Heartburn: Secondary | ICD-10-CM | POA: Diagnosis not present

## 2023-01-28 MED ORDER — HYDROXYZINE HCL 10 MG PO TABS: 10.0000 mg | ORAL_TABLET | Freq: Three times a day (TID) | ORAL | 0 refills | Status: AC | PRN

## 2023-01-28 NOTE — Assessment & Plan Note (Signed)
 Continue diet and exercise.   Commended on weight loss.   Referral placed for healthy weight and wellness.

## 2023-01-28 NOTE — Assessment & Plan Note (Signed)
 Uncontrolled. Has tried zoloft in the past. Would like to do as needed medication.   Start hydroxyzine 10 mg TID as needed for anxiety.  Rx sent.  Declines therapy at this time.

## 2023-01-28 NOTE — Progress Notes (Signed)
 Established Patient Office Visit  Subjective   Patient ID: Robin Cameron, female    DOB: 21-Dec-1995  Age: 28 y.o. MRN: [id number]  Chief Complaint  Patient presents with   Wound Check    1 wk wound f/u    Wound Check    Robin Cameron is a 28 year old female presenting today for a follow up.   Right hand wound: She was evaluated at the ER on 01/11/23 for a laceration for which she received stiches. Sutures were removed on 01/21/23. She was prescribed doxycycline  and she was not able to tolerate it. She was switched to sulfamethoxazole -trimethoprim  for 7 days. She has been able to tolerate that and has one dose left for this evening. She is still taking the omeprazole  to help with tolerating the antibiotic. She is doing well overall. Her wound is doing well. It is itchy and dry.   Bipolar depression: diagnosed several years ago. She was treated with Zoloft  for a brief period. She says that it did not help her symptoms. It caused weight gain and so she discontinued the medication. Overall, she feels her depression has decreased.   Anxiety: there are moments when she can't sit still. Mind racing thoughts and worrying during the episodes. She thinks of the worst possible scenarios. She denies any SI/HI.   Morbid obesity: She has been doing the exercise and diet.she has been doing infinity loop and zumba. She has lost 50 lbs in the last 8 months. She is doing better but she would like some assistant with a diet plan.   Patient Active Problem List   Diagnosis Date Noted   Anxiety 01/28/2023   Laceration of right thumb without foreign body without damage to nail 01/21/2023   Heart burn 01/21/2023   Morbid obesity with body mass index (BMI) of 50.0 to 59.9 in adult [facility]) 10/08/2018   Past Medical History:  Diagnosis Date   Allergic rhinitis    GERD (gastroesophageal reflux disease) 12/13/2012   History of gestational hypertension 02/03/2019   Obesity    Allergies  Allergen Reactions    Shrimp [Shellfish Allergy] Rash   Amoxicillin  Hives    States was pretty significant with full body hives   Doxycycline  Nausea Only   Mushroom Extract Complex (Do Not Select) Rash         01/17/2023   12:54 PM 12/31/2022    2:19 PM 04/15/2019   10:59 AM  Depression screen PHQ 2/9  Decreased Interest 1 1 0  Down, Depressed, Hopeless 1 2 1   PHQ - 2 Score 2 3 1   Altered sleeping 0 1 3  Tired, decreased energy 2 1 3   Change in appetite 2 2 2   Feeling bad or failure about yourself  1 3 3   Trouble concentrating 0 0 0  Moving slowly or fidgety/restless 1 0 3  Suicidal thoughts 0 0 0  PHQ-9 Score 8 10 15   Difficult doing work/chores Somewhat difficult Somewhat difficult Somewhat difficult       01/17/2023   12:54 PM 12/31/2022    2:19 PM 04/15/2019   11:00 AM  GAD 7 : Generalized Anxiety Score  Nervous, Anxious, on Edge 0 1 0  Control/stop worrying 1 1 0  Worry too much - different things 2 2 1   Trouble relaxing 0 1 2  Restless 1 2 3   Easily annoyed or irritable 1 2 0  Afraid - awful might happen 1 1 2   Total GAD 7 Score 6 10 8  Anxiety Difficulty Somewhat difficult Very difficult Somewhat difficult      Review of Systems  Constitutional:  Negative for chills and fever.  Respiratory:  Negative for shortness of breath.   Cardiovascular:  Negative for chest pain.  Gastrointestinal:  Negative for abdominal pain, constipation, diarrhea, heartburn, nausea and vomiting.  Genitourinary:  Negative for dysuria, frequency and urgency.  Neurological:  Negative for dizziness and headaches.  Endo/Heme/Allergies:  Negative for polydipsia.  Psychiatric/Behavioral:  Negative for depression and suicidal ideas. The patient is not nervous/anxious.       Objective:     BP 134/86   Pulse 75   Temp 97.8 F (36.6 C) (Oral)   Ht 5' 6 (1.676 m)   Wt (!) 327 lb (148.3 kg)   LMP 01/05/2023   SpO2 99%   BMI 52.78 kg/m  BP Readings from Last 3 Encounters:  01/28/23 134/86  01/21/23  132/84  01/17/23 130/76   Wt Readings from Last 3 Encounters:  01/28/23 (!) 327 lb (148.3 kg)  01/21/23 (!) 324 lb (147 kg)  01/17/23 (!) 324 lb (147 kg)      Physical Exam Vitals and nursing note reviewed.  Constitutional:      Appearance: Normal appearance.  Cardiovascular:     Rate and Rhythm: Normal rate and regular rhythm.     Pulses: Normal pulses.     Heart sounds: Normal heart sounds.  Pulmonary:     Effort: Pulmonary effort is normal.     Breath sounds: Normal breath sounds.  Skin:    Findings: Laceration present.     Comments: Laceration (healing base of right thumb. no erythema no discharge.)  Neurological:     Mental Status: She is alert and oriented to person, place, and time.  Psychiatric:        Mood and Affect: Mood normal.        Behavior: Behavior normal.        Thought Content: Thought content normal.        Judgment: Judgment normal.      No results found for any visits on 01/28/23.     The ASCVD Risk score (Arnett DK, et al., 2019) failed to calculate for the following reasons:   The 2019 ASCVD risk score is only valid for ages 73 to 58    Assessment & Plan:  Anxiety Assessment & Plan: Uncontrolled. Has tried zoloft  in the past. Would like to do as needed medication.   Start hydroxyzine  10 mg TID as needed for anxiety.  Rx sent.  Declines therapy at this time.   Orders: -     hydrOXYzine  HCl; Take 1 tablet (10 mg total) by mouth 3 (three) times daily as needed for anxiety.  Dispense: 30 tablet; Refill: 0  Morbid obesity with body mass index (BMI) of 50.0 to 59.9 in adult [facility]) Assessment & Plan: Continue diet and exercise.   Commended on weight loss.   Referral placed for healthy weight and wellness.    Orders: -     Amb Ref to Medical Weight Management  Heart burn Assessment & Plan: Controlled.      Return if symptoms worsen or fail to improve.    Carrol Aurora, NP

## 2023-01-28 NOTE — Assessment & Plan Note (Signed)
 Improving. Wound is healing well however superficial dermis is still not approximated completely without signs of infection. 3 steri streips application across incision. Wound care and home instructions given to patient.   Patient advised to continue to monitor site for worsening signs/symptoms of infection including increasing redness, tenderness, size and/or pustulant drainage from site. She will update if this occurs.   Patient verbalizes understanding.

## 2023-01-28 NOTE — Assessment & Plan Note (Signed)
 Controlled.

## 2023-01-28 NOTE — Patient Instructions (Signed)
 Continue steri-strips for one more week. Minimal expsoure of water to wound. Continue to watch for infection.   Finish antibiotics.   Start hydroxyzine as needed for anxiety.   It was a pleasure to see you today!

## 2023-03-25 ENCOUNTER — Encounter (INDEPENDENT_AMBULATORY_CARE_PROVIDER_SITE_OTHER): Payer: Self-pay

## 2023-04-04 ENCOUNTER — Encounter (INDEPENDENT_AMBULATORY_CARE_PROVIDER_SITE_OTHER): Payer: Self-pay
